# Patient Record
Sex: Female | Born: 1935 | Race: White | Hispanic: No | Smoking: Current every day smoker
Health system: Southern US, Community
[De-identification: ages and names within clinical notes are randomized; demographics above are authoritative.]

## PROBLEM LIST (undated history)

## (undated) DIAGNOSIS — J449 Chronic obstructive pulmonary disease, unspecified: Secondary | ICD-10-CM

## (undated) DIAGNOSIS — C801 Malignant (primary) neoplasm, unspecified: Secondary | ICD-10-CM

## (undated) DIAGNOSIS — I10 Essential (primary) hypertension: Secondary | ICD-10-CM

## (undated) DIAGNOSIS — E785 Hyperlipidemia, unspecified: Secondary | ICD-10-CM

---

## 2012-09-06 ENCOUNTER — Other Ambulatory Visit (HOSPITAL_COMMUNITY): Payer: Self-pay | Admitting: Urology

## 2012-09-06 DIAGNOSIS — D494 Neoplasm of unspecified behavior of bladder: Secondary | ICD-10-CM

## 2012-09-07 ENCOUNTER — Other Ambulatory Visit (HOSPITAL_COMMUNITY): Payer: Self-pay | Admitting: Urology

## 2012-09-07 ENCOUNTER — Encounter (HOSPITAL_COMMUNITY): Payer: Self-pay

## 2012-09-07 ENCOUNTER — Ambulatory Visit (HOSPITAL_COMMUNITY)
Admission: RE | Admit: 2012-09-07 | Discharge: 2012-09-07 | Disposition: A | Discharge status: Home / Self Care | Payer: Medicare Other | Source: Ambulatory Visit | Attending: Urology | Admitting: Urology

## 2012-09-07 DIAGNOSIS — Z8551 Personal history of malignant neoplasm of bladder: Secondary | ICD-10-CM | POA: Insufficient documentation

## 2012-09-07 DIAGNOSIS — I10 Essential (primary) hypertension: Secondary | ICD-10-CM | POA: Insufficient documentation

## 2012-09-07 DIAGNOSIS — Z888 Allergy status to other drugs, medicaments and biological substances status: Secondary | ICD-10-CM | POA: Insufficient documentation

## 2012-09-07 DIAGNOSIS — D494 Neoplasm of unspecified behavior of bladder: Secondary | ICD-10-CM

## 2012-09-07 DIAGNOSIS — N1330 Unspecified hydronephrosis: Secondary | ICD-10-CM | POA: Insufficient documentation

## 2012-09-07 DIAGNOSIS — E785 Hyperlipidemia, unspecified: Secondary | ICD-10-CM | POA: Insufficient documentation

## 2012-09-07 HISTORY — DX: Chronic obstructive pulmonary disease, unspecified: J44.9

## 2012-09-07 HISTORY — DX: Malignant (primary) neoplasm, unspecified: C80.1

## 2012-09-07 HISTORY — DX: Hyperlipidemia, unspecified: E78.5

## 2012-09-07 HISTORY — DX: Essential (primary) hypertension: I10

## 2012-09-07 MED ORDER — HYDROCODONE-ACETAMINOPHEN 5-325 MG PO TABS: 1.0000 | ORAL_TABLET | ORAL | Status: DC | PRN

## 2012-09-07 MED ORDER — CIPROFLOXACIN IN D5W 400 MG/200ML IV SOLN
400.0000 mg | Freq: Once | INTRAVENOUS | Status: AC
  Administered 2012-09-07: 400 mg via INTRAVENOUS
  Filled 2012-09-07: qty 200

## 2012-09-07 MED ORDER — MIDAZOLAM HCL 2 MG/2ML IJ SOLN
INTRAMUSCULAR | Status: AC
  Filled 2012-09-07: qty 4

## 2012-09-07 MED ORDER — FENTANYL CITRATE 0.05 MG/ML IJ SOLN
INTRAMUSCULAR | Status: AC | PRN
  Administered 2012-09-07 (×2): 25 ug via INTRAVENOUS

## 2012-09-07 MED ORDER — MIDAZOLAM HCL 2 MG/2ML IJ SOLN
INTRAMUSCULAR | Status: AC | PRN
  Administered 2012-09-07: 1 mg via INTRAVENOUS
  Administered 2012-09-07 (×2): 0.5 mg via INTRAVENOUS

## 2012-09-07 MED ORDER — FENTANYL CITRATE 0.05 MG/ML IJ SOLN
INTRAMUSCULAR | Status: AC
  Filled 2012-09-07: qty 4

## 2012-09-07 MED ORDER — IOHEXOL 300 MG/ML  SOLN
50.0000 mL | Freq: Once | INTRAMUSCULAR | Status: AC | PRN
  Administered 2012-09-07: 1 mL

## 2012-09-07 NOTE — Procedures (Signed)
Interventional Radiology Procedure Note  Procedure: Placement of bilateral 47F percutaneous nephrostomy tubes Complications: None Recommendations: - Maintain to gravity drainage - Bedrest x 4 hrs, if doping well after can DC home - Return to IR in 4 weeks for tube check and internalization to ureteral stents  Signed,  Sterling Big, MD Vascular & Interventional Radiologist [facility]

## 2012-09-07 NOTE — ED Notes (Signed)
Right nephrostomy tube had some urine leakage from bag as closure was only partly closed.

## 2012-09-07 NOTE — ED Notes (Signed)
Nephrostomy tube dressings clean dry and intact.

## 2012-09-07 NOTE — ED Notes (Signed)
Left and right nephrostomy tube dressings clean dry and intact

## 2012-09-07 NOTE — ED Notes (Signed)
Left and right  Nephrostomy tubes clean dry and intact

## 2012-09-07 NOTE — ED Notes (Signed)
Right and Left nephrostomy tube dressings clean dry and intact

## 2012-09-07 NOTE — ED Notes (Signed)
Nephrostomy tube dressings clean dry and intact, draining clearer pink urine.

## 2012-09-07 NOTE — H&P (Signed)
Colleen Callahan is an 79 y.o. female.   Chief Complaint:  Hx bladder ca New bladder tumor per Dr Jerre Simon B hydronephrosis Attempt at retrograde stents yesterday- unsuccessful with Dr Jerre Simon Request now for Bilateral percutaneous nephrostomy placement  HPI: HLD; HTN; bladder ca  Past Medical History  Diagnosis Date  . Hyperlipidemia   . Hypertension     No past surgical history on file.  No family history on file. Social History:  reports that she has never smoked. She does not have any smokeless tobacco history on file. Her alcohol and drug histories are not on file.  Allergies:  Allergies  Allergen Reactions  . Hctz (Hydrochlorothiazide)   . Lipitor (Atorvastatin)      (Not in a hospital admission)  No results found for this or any previous visit (from the past 48 hour(s)). No results found.  Review of Systems  Gastrointestinal: Negative for nausea, vomiting and abdominal pain.  Genitourinary: Positive for dysuria.  Neurological: Positive for weakness.    There were no vitals taken for this visit. Physical Exam  Constitutional: She is oriented to person, place, and time. She appears well-nourished.  Cardiovascular: Normal rate and regular rhythm.   Murmur heard. Respiratory: Effort normal and breath sounds normal. She has no wheezes.  GI: Soft. Bowel sounds are normal. There is no tenderness.  Musculoskeletal: Normal range of motion.  Uses walker/wc  Neurological: She is alert and oriented to person, place, and time.  Psychiatric: She has a normal mood and affect. Her behavior is normal. Judgment and thought content normal.     Assessment/Plan Hx Bladder ca B hydro Unsuccessful stents with Dr Jerre Simon yesterday Request for B PCNs in IR Pt and caretaker aware of procedure benefits and risks and agreeable to proceed Consent signed and in chart  Leeana Creer A 09/07/2012, 10:26 AM

## 2012-09-07 NOTE — H&P (Signed)
Agree with PA note.    Signed,  Heath K. McCullough, MD Vascular & Interventional Radiologist [facility]  

## 2012-09-07 NOTE — ED Notes (Signed)
Dr Archer Asa reviewed discharge instructions post nephrostomy tube placement and agrees with same to be sent home with patient.

## 2012-09-21 ENCOUNTER — Other Ambulatory Visit (HOSPITAL_COMMUNITY): Payer: Self-pay | Admitting: Urology

## 2012-09-21 DIAGNOSIS — N186 End stage renal disease: Secondary | ICD-10-CM

## 2012-09-24 ENCOUNTER — Ambulatory Visit (HOSPITAL_COMMUNITY)
Admission: RE | Admit: 2012-09-24 | Discharge: 2012-09-24 | Disposition: A | Discharge status: Home / Self Care | Payer: Medicare Other | Source: Ambulatory Visit | Attending: Urology | Admitting: Urology

## 2012-09-24 ENCOUNTER — Other Ambulatory Visit (HOSPITAL_COMMUNITY): Payer: Self-pay | Admitting: Urology

## 2012-09-24 ENCOUNTER — Other Ambulatory Visit: Payer: Self-pay | Admitting: Radiology

## 2012-09-24 ENCOUNTER — Encounter (HOSPITAL_COMMUNITY): Payer: Self-pay

## 2012-09-24 DIAGNOSIS — Z436 Encounter for attention to other artificial openings of urinary tract: Secondary | ICD-10-CM | POA: Insufficient documentation

## 2012-09-24 DIAGNOSIS — Z888 Allergy status to other drugs, medicaments and biological substances status: Secondary | ICD-10-CM | POA: Insufficient documentation

## 2012-09-24 DIAGNOSIS — N1330 Unspecified hydronephrosis: Secondary | ICD-10-CM | POA: Insufficient documentation

## 2012-09-24 DIAGNOSIS — Z79899 Other long term (current) drug therapy: Secondary | ICD-10-CM | POA: Insufficient documentation

## 2012-09-24 DIAGNOSIS — J449 Chronic obstructive pulmonary disease, unspecified: Secondary | ICD-10-CM | POA: Insufficient documentation

## 2012-09-24 DIAGNOSIS — R011 Cardiac murmur, unspecified: Secondary | ICD-10-CM | POA: Insufficient documentation

## 2012-09-24 DIAGNOSIS — N186 End stage renal disease: Secondary | ICD-10-CM

## 2012-09-24 DIAGNOSIS — I10 Essential (primary) hypertension: Secondary | ICD-10-CM | POA: Insufficient documentation

## 2012-09-24 DIAGNOSIS — C679 Malignant neoplasm of bladder, unspecified: Secondary | ICD-10-CM | POA: Insufficient documentation

## 2012-09-24 DIAGNOSIS — J4489 Other specified chronic obstructive pulmonary disease: Secondary | ICD-10-CM | POA: Insufficient documentation

## 2012-09-24 DIAGNOSIS — E785 Hyperlipidemia, unspecified: Secondary | ICD-10-CM | POA: Insufficient documentation

## 2012-09-24 LAB — BASIC METABOLIC PANEL
BUN: 36 mg/dL — ABNORMAL HIGH (ref 6–23)
CO2: 28 mEq/L (ref 19–32)
Chloride: 104 mEq/L (ref 96–112)
Creatinine, Ser: 2.13 mg/dL — ABNORMAL HIGH (ref 0.50–1.10)
Glucose, Bld: 91 mg/dL (ref 70–99)
Potassium: 4 mEq/L (ref 3.5–5.1)

## 2012-09-24 LAB — CBC
Hemoglobin: 10.4 g/dL — ABNORMAL LOW (ref 12.0–15.0)
MCH: 29.1 pg (ref 26.0–34.0)
MCHC: 33.3 g/dL (ref 30.0–36.0)
MCV: 87.2 fL (ref 78.0–100.0)
RBC: 3.58 MIL/uL — ABNORMAL LOW (ref 3.87–5.11)

## 2012-09-24 MED ORDER — MIDAZOLAM HCL 2 MG/2ML IJ SOLN
INTRAMUSCULAR | Status: AC
  Filled 2012-09-24: qty 4

## 2012-09-24 MED ORDER — SODIUM CHLORIDE 0.9 % IV SOLN
INTRAVENOUS | Status: DC
  Administered 2012-09-24: 08:00:00 via INTRAVENOUS

## 2012-09-24 MED ORDER — FENTANYL CITRATE 0.05 MG/ML IJ SOLN
INTRAMUSCULAR | Status: AC
  Filled 2012-09-24: qty 2

## 2012-09-24 MED ORDER — FENTANYL CITRATE 0.05 MG/ML IJ SOLN
INTRAMUSCULAR | Status: AC | PRN
  Administered 2012-09-24 (×2): 50 ug via INTRAVENOUS

## 2012-09-24 MED ORDER — MIDAZOLAM HCL 2 MG/2ML IJ SOLN
INTRAMUSCULAR | Status: AC | PRN
  Administered 2012-09-24 (×2): 1 mg via INTRAVENOUS

## 2012-09-24 MED ORDER — IOHEXOL 300 MG/ML  SOLN
50.0000 mL | Freq: Once | INTRAMUSCULAR | Status: AC | PRN
  Administered 2012-09-24: 25 mL via INTRAVENOUS

## 2012-09-24 MED ORDER — CIPROFLOXACIN IN D5W 400 MG/200ML IV SOLN
400.0000 mg | Freq: Once | INTRAVENOUS | Status: AC
  Administered 2012-09-24: 400 mg via INTRAVENOUS
  Filled 2012-09-24: qty 200

## 2012-09-24 NOTE — Procedures (Signed)
Procedure:  Bilateral nephrostograms, bilateral PCN change, right ureteral stent placement. Findings:  Critical distal left ureteral stricture.  Unable to pass with catheter and therefore unable to place ureteral stent.  10 Fr PCN replaced and attached to gravity bag. Signif residual right hydronephrosis.  8 Fr x 22 cm right ureteral stent placed from pelvis to bladder.  New 10 Fr PCN placed and capped.

## 2012-09-24 NOTE — H&P (Signed)
Colleen Callahan is an 79 y.o. female.   Chief Complaint:  Hx bladder ca New bladder tumor per Dr Jerre Simon B hydronephrosis with (B) PCN placement on 4/19. She has done well. She is scheduled today attempt at (B) JJ stent placement.  HPI: HLD; HTN; bladder ca  Past Medical History  Diagnosis Date  . Hyperlipidemia   . Hypertension   . Cancer     bladder  . COPD (chronic obstructive pulmonary disease)     History reviewed. No pertinent past surgical history.  No family history on file. Social History:  reports that she has never smoked. She does not have any smokeless tobacco history on file. Her alcohol and drug histories are not on file.  Allergies:  Allergies  Allergen Reactions  . Hctz (Hydrochlorothiazide) Other (See Comments)    unknown  . Lipitor (Atorvastatin) Other (See Comments)    unknown    Meds: Norvasc, Tenormin, Advair, Niferex, Remeron, Zantac, Crestor  Results for orders placed during the hospital encounter of 09/24/12 (from the past 48 hour(s))  CBC     Status: Abnormal   Collection Time    09/24/12  7:42 AM      Result Value Range   WBC 8.2  4.0 - 10.5 K/uL   RBC 3.58 (*) 3.87 - 5.11 MIL/uL   Hemoglobin 10.4 (*) 12.0 - 15.0 g/dL   HCT 16.1 (*) 09.6 - 04.5 %   MCV 87.2  78.0 - 100.0 fL   MCH 29.1  26.0 - 34.0 pg   MCHC 33.3  30.0 - 36.0 g/dL   RDW 40.9  81.1 - 91.4 %   Platelets 314  150 - 400 K/uL  PROTIME-INR     Status: None   Collection Time    09/24/12  7:42 AM      Result Value Range   Prothrombin Time 12.7  11.6 - 15.2 seconds   INR 0.96  0.00 - 1.49  BASIC METABOLIC PANEL     Status: Abnormal   Collection Time    09/24/12  7:42 AM      Result Value Range   Sodium 140  135 - 145 mEq/L   Potassium 4.0  3.5 - 5.1 mEq/L   Chloride 104  96 - 112 mEq/L   CO2 28  19 - 32 mEq/L   Glucose, Bld 91  70 - 99 mg/dL   BUN 36 (*) 6 - 23 mg/dL   Creatinine, Ser 7.82 (*) 0.50 - 1.10 mg/dL   Calcium 9.5  8.4 - 95.6 mg/dL   GFR calc non Af Amer 21  (*) >90 mL/min   GFR calc Af Amer 25 (*) >90 mL/min   Comment:            The eGFR has been calculated     using the CKD EPI equation.     This calculation has not been     validated in all clinical     situations.     eGFR's persistently     <90 mL/min signify     possible Chronic Kidney Disease.   No results found.  Review of Systems  Gastrointestinal: Negative for nausea, vomiting and abdominal pain.  Neurological: Positive for weakness.    Blood pressure 122/69, pulse 62, temperature 97.4 F (36.3 C), temperature source Oral, resp. rate 16, height 5\' 6"  (1.676 m), weight 160 lb (72.576 kg), SpO2 95.00%.  Physical Exam  Constitutional: She is oriented to person, place, and time. She appears well-nourished.  Cardiovascular: Normal rate and regular rhythm.   Murmur heard. Respiratory: Effort normal and breath sounds normal. She has no wheezes.  GI: Soft. Bowel sounds are normal. There is no tenderness.  Musculoskeletal: Normal range of motion.  Uses walker/wc  Neurological: She is alert and oriented to person, place, and time.  Psychiatric: She has a normal mood and affect. Her behavior is normal. Judgment and thought content normal.     Assessment/Plan Hx Bladder ca B hydro s/p (B)PCN 4/19 Here for attempt at JJ stents, discussed procedure, risks, complications, use of sedation Pt and caretaker aware of procedure benefits and risks and agreeable to proceed Labs reviewed, ok Consent signed and in chart  Brayton El 09/24/2012, 8:42 AM

## 2012-09-24 NOTE — H&P (Signed)
Agree 

## 2012-10-09 ENCOUNTER — Emergency Department (HOSPITAL_COMMUNITY)
Admission: EM | Admit: 2012-10-09 | Discharge: 2012-10-09 | Disposition: A | Discharge status: Home / Self Care | Payer: Medicare Other | Attending: Emergency Medicine | Admitting: Emergency Medicine

## 2012-10-09 ENCOUNTER — Emergency Department (HOSPITAL_COMMUNITY): Payer: Medicare Other

## 2012-10-09 ENCOUNTER — Encounter (HOSPITAL_COMMUNITY): Payer: Self-pay | Admitting: Emergency Medicine

## 2012-10-09 DIAGNOSIS — I10 Essential (primary) hypertension: Secondary | ICD-10-CM | POA: Insufficient documentation

## 2012-10-09 DIAGNOSIS — T593X4A Toxic effect of lacrimogenic gas, undetermined, initial encounter: Secondary | ICD-10-CM | POA: Insufficient documentation

## 2012-10-09 DIAGNOSIS — N99528 Other complication of other external stoma of urinary tract: Secondary | ICD-10-CM

## 2012-10-09 DIAGNOSIS — E785 Hyperlipidemia, unspecified: Secondary | ICD-10-CM | POA: Insufficient documentation

## 2012-10-09 DIAGNOSIS — Z79899 Other long term (current) drug therapy: Secondary | ICD-10-CM | POA: Insufficient documentation

## 2012-10-09 DIAGNOSIS — J4489 Other specified chronic obstructive pulmonary disease: Secondary | ICD-10-CM | POA: Insufficient documentation

## 2012-10-09 DIAGNOSIS — J449 Chronic obstructive pulmonary disease, unspecified: Secondary | ICD-10-CM | POA: Insufficient documentation

## 2012-10-09 DIAGNOSIS — Z8551 Personal history of malignant neoplasm of bladder: Secondary | ICD-10-CM | POA: Insufficient documentation

## 2012-10-09 DIAGNOSIS — Y833 Surgical operation with formation of external stoma as the cause of abnormal reaction of the patient, or of later complication, without mention of misadventure at the time of the procedure: Secondary | ICD-10-CM | POA: Insufficient documentation

## 2012-10-09 MED ORDER — IOHEXOL 300 MG/ML  SOLN
50.0000 mL | Freq: Once | INTRAMUSCULAR | Status: AC | PRN
  Administered 2012-10-09: 10 mL

## 2012-10-09 NOTE — ED Notes (Signed)
Patient denies pain and is resting comfortably.  

## 2012-10-09 NOTE — ED Notes (Signed)
Pt here from home with care giver c/o urostomy bag leaking at tub on right side; pt sts currently being treated for UTI also

## 2012-10-09 NOTE — ED Provider Notes (Signed)
History     CSN: [id number]  Arrival date & time 10/09/12  1105   First MD Initiated Contact with Patient 10/09/12 1155      No chief complaint on file.   (Consider location/radiation/quality/duration/timing/severity/associated sxs/prior treatment) HPI Comments: 79 y.o. female who presents to the ER w/ her urostomy bag that is leaking. Pt states / and her family member, state that the leaking started this morning. No fevers or chills. Pt had the bag placed secondary to kidney stones. She is on prophylaxis for UTI w/ bactrim.   Patient is a 79 y.o. female presenting with general illness. The history is provided by the patient and a caregiver.  Illness Location:  Urostomy bag on back Severity:  Mild Onset quality:  Gradual Timing:  Constant Progression:  Unchanged Chronicity:  New Associated symptoms: no abdominal pain, no chest pain, no congestion, no cough, no diarrhea, no fatigue, no fever, no headaches, no rash, no vomiting and no wheezing     Past Medical History  Diagnosis Date  . Hyperlipidemia   . Hypertension   . Cancer     bladder  . COPD (chronic obstructive pulmonary disease)     History reviewed. No pertinent past surgical history.  History reviewed. No pertinent family history.  History  Substance Use Topics  . Smoking status: Never Smoker   . Smokeless tobacco: Not on file  . Alcohol Use: Not on file    OB History   Grav Para Term Preterm Abortions TAB SAB Ect Mult Living                  Review of Systems  Constitutional: Negative for fever, chills and fatigue.  HENT: Negative for congestion, facial swelling, drooling, neck pain and dental problem.   Eyes: Negative for pain, discharge and itching.  Respiratory: Negative for cough, choking, wheezing and stridor.   Cardiovascular: Negative for chest pain.  Gastrointestinal: Negative for vomiting, abdominal pain and diarrhea.  Endocrine: Negative for cold intolerance and heat intolerance.   Genitourinary: Negative for vaginal discharge, difficulty urinating and vaginal pain.  Skin: Negative for pallor and rash.  Neurological: Negative for dizziness, light-headedness and headaches.  Psychiatric/Behavioral: Negative for behavioral problems and agitation.    Allergies  Hctz and Lipitor  Home Medications   Current Outpatient Rx  Name  Route  Sig  Dispense  Refill  . Acetaminophen (TYLENOL ARTHRITIS PAIN PO)   Oral   Take 1 tablet by mouth 4 (four) times daily as needed (arthritis).         Marland Kitchen amLODipine (NORVASC) 10 MG tablet   Oral   Take 10 mg by mouth daily.         Marland Kitchen atenolol (TENORMIN) 50 MG tablet   Oral   Take 50 mg by mouth daily.         . Fluticasone-Salmeterol (ADVAIR) 250-50 MCG/DOSE AEPB   Inhalation   Inhale 1 puff into the lungs every 12 (twelve) hours.         . iron polysaccharides (NIFEREX) 150 MG capsule   Oral   Take 150 mg by mouth 2 (two) times daily.         . mirtazapine (REMERON) 15 MG tablet   Oral   Take 15 mg by mouth at bedtime.         . ranitidine (ZANTAC) 150 MG tablet   Oral   Take 150 mg by mouth 2 (two) times daily.         Marland Kitchen  rosuvastatin (CRESTOR) 10 MG tablet   Oral   Take 10 mg by mouth at bedtime.           BP 104/83  Pulse 60  Temp(Src) 98.3 F (36.8 C) (Oral)  Resp 18  SpO2 95%  Physical Exam  Constitutional: She is oriented to person, place, and time. She appears well-developed. No distress.  HENT:  Head: Normocephalic and atraumatic.  Eyes: Pupils are equal, round, and reactive to light. Right eye exhibits no discharge. Left eye exhibits no discharge.  Neck: Neck supple. No tracheal deviation present.  Cardiovascular: Normal rate.  Exam reveals no gallop and no friction rub.   Pulmonary/Chest: No stridor. No respiratory distress. She has no wheezes.  Abdominal: Soft. She exhibits no distension. There is no tenderness. There is no rebound.  Genitourinary:  Pt with two urostomy tube  sites -- they are sutured in place, clear, dry, intact. The right urostomy tube, the outer portion, has a damaged tube that is leaking her urine. It is still draining.   Musculoskeletal: She exhibits no edema and no tenderness.  Neurological: She is alert and oriented to person, place, and time.  Skin: Skin is warm. She is not diaphoretic.    ED Course  Procedures (including critical care time)  Labs Reviewed - No data to display No results found.  MDM  Have placed pt's tube back into the urine collection bag. Have tried to call interventional team that did the procedure but unfortunately they are having an office meeting and I have left a voice mail. Pt will need the outer portion of her tubing fixed. Does not need labs as no fevers, no vital sign abnormalities, no back pain -- and both urostomy sites are adequately draining urine.   I have talked to our interventional team, who state they are happy to change out pt's IR tube while she is in the ER -- pt is taken to the IR suite from the ER.   IR team has fixed pt's tubing from urostomy site -- they continue to drain normal appearing urine.   1. Complication of urostomy            Bernadene Person, MD 10/09/12 1446

## 2012-10-09 NOTE — ED Notes (Signed)
Pt with bilateral urostomy tubes.  Right side tube has pulled loose from the connector and is leaking.  Family member advises it has been leaking since it was placed 2 weeks ago.

## 2012-10-10 ENCOUNTER — Telehealth (HOSPITAL_COMMUNITY): Payer: Self-pay | Admitting: *Deleted

## 2012-10-11 NOTE — ED Provider Notes (Signed)
I saw and evaluated the patient, reviewed the resident's note and I agree with the findings and plan.   .Face to face Exam:  General:  Awake HEENT:  Atraumatic Resp:  Normal effort Abd:  Nondistended Neuro:No focal weakness Lymph: No adenopathy  Nelia Shi, MD 10/11/12 1353

## 2012-11-06 ENCOUNTER — Other Ambulatory Visit (HOSPITAL_COMMUNITY): Payer: Self-pay | Admitting: Urology

## 2012-11-06 DIAGNOSIS — D494 Neoplasm of unspecified behavior of bladder: Secondary | ICD-10-CM

## 2012-11-07 ENCOUNTER — Other Ambulatory Visit: Payer: Self-pay | Admitting: Radiology

## 2012-11-07 ENCOUNTER — Encounter (HOSPITAL_COMMUNITY): Payer: Self-pay | Admitting: Pharmacy Technician

## 2012-11-08 ENCOUNTER — Ambulatory Visit (HOSPITAL_COMMUNITY): Admission: RE | Admit: 2012-11-08 | Payer: Medicare Other | Source: Ambulatory Visit

## 2012-11-08 ENCOUNTER — Other Ambulatory Visit (HOSPITAL_COMMUNITY): Payer: Self-pay | Admitting: Urology

## 2012-11-08 ENCOUNTER — Telehealth (HOSPITAL_COMMUNITY): Payer: Self-pay | Admitting: Radiology

## 2012-11-08 ENCOUNTER — Ambulatory Visit (HOSPITAL_COMMUNITY)
Admission: RE | Admit: 2012-11-08 | Discharge: 2012-11-08 | Disposition: A | Discharge status: Home / Self Care | Payer: Medicare Other | Source: Ambulatory Visit | Attending: Urology | Admitting: Urology

## 2012-11-08 ENCOUNTER — Encounter (HOSPITAL_COMMUNITY): Payer: Self-pay

## 2012-11-08 DIAGNOSIS — J449 Chronic obstructive pulmonary disease, unspecified: Secondary | ICD-10-CM | POA: Insufficient documentation

## 2012-11-08 DIAGNOSIS — D494 Neoplasm of unspecified behavior of bladder: Secondary | ICD-10-CM | POA: Insufficient documentation

## 2012-11-08 DIAGNOSIS — J4489 Other specified chronic obstructive pulmonary disease: Secondary | ICD-10-CM | POA: Insufficient documentation

## 2012-11-08 DIAGNOSIS — E785 Hyperlipidemia, unspecified: Secondary | ICD-10-CM | POA: Insufficient documentation

## 2012-11-08 DIAGNOSIS — Z8551 Personal history of malignant neoplasm of bladder: Secondary | ICD-10-CM | POA: Insufficient documentation

## 2012-11-08 DIAGNOSIS — N1330 Unspecified hydronephrosis: Secondary | ICD-10-CM | POA: Insufficient documentation

## 2012-11-08 DIAGNOSIS — I10 Essential (primary) hypertension: Secondary | ICD-10-CM | POA: Insufficient documentation

## 2012-11-08 MED ORDER — MIDAZOLAM HCL 2 MG/2ML IJ SOLN
INTRAMUSCULAR | Status: AC | PRN
  Administered 2012-11-08 (×2): 1 mg via INTRAVENOUS

## 2012-11-08 MED ORDER — CIPROFLOXACIN IN D5W 400 MG/200ML IV SOLN
400.0000 mg | Freq: Once | INTRAVENOUS | Status: AC
  Administered 2012-11-08: 400 mg via INTRAVENOUS
  Filled 2012-11-08: qty 200

## 2012-11-08 MED ORDER — IOHEXOL 300 MG/ML  SOLN
50.0000 mL | Freq: Once | INTRAMUSCULAR | Status: AC | PRN
  Administered 2012-11-08: 50 mL

## 2012-11-08 MED ORDER — FENTANYL CITRATE 0.05 MG/ML IJ SOLN
INTRAMUSCULAR | Status: AC | PRN
  Administered 2012-11-08 (×2): 50 ug via INTRAVENOUS

## 2012-11-08 MED ORDER — SODIUM CHLORIDE 0.9 % IV SOLN: INTRAVENOUS | Status: DC

## 2012-11-08 MED ORDER — MIDAZOLAM HCL 2 MG/2ML IJ SOLN
INTRAMUSCULAR | Status: AC
  Filled 2012-11-08: qty 4

## 2012-11-08 MED ORDER — FENTANYL CITRATE 0.05 MG/ML IJ SOLN
INTRAMUSCULAR | Status: AC
  Filled 2012-11-08: qty 4

## 2012-11-08 NOTE — Procedures (Signed)
Successful right sided PCN exchange.  The right sided PCN will be capped for a trial of attempted internalization. Successful traversal of distal left ureteral obstruction, however stent placement was deemed inappropriate secondary to the large irregular mass replacing the left lateral wall of the urinary bladder. Successful left sided PCN exchange.  The left sided PCN was connected to a gravity bag.

## 2012-11-08 NOTE — Telephone Encounter (Signed)
Patient arrived at [facility] for IR procedure instead of [facility].  Arrangements made for patient to be treated at [facility]

## 2012-11-08 NOTE — H&P (Signed)
Colleen Callahan is an 79 y.o. female.   Chief Complaint: Bladder Ca hx New tumor New hydronephrosis with B percutaneous nephrostomy placed 09/08/12 JJ on Rt 09/24/12 Unsuccessful on left Scheduled now for Rt nephrostogram with possible nephrostomy change Possible Lt JJ placement vs nephrostomy change HPI: HLD; HTN; bladder ca; COPD  Past Medical History  Diagnosis Date  . Hyperlipidemia   . Hypertension   . Cancer     bladder  . COPD (chronic obstructive pulmonary disease)     History reviewed. No pertinent past surgical history.  No family history on file. Social History:  reports that she has never smoked. She does not have any smokeless tobacco history on file. Her alcohol and drug histories are not on file.  Allergies:  Allergies  Allergen Reactions  . Hctz (Hydrochlorothiazide) Other (See Comments)    unknown  . Lipitor (Atorvastatin) Other (See Comments)    unknown     (Not in a hospital admission)  No results found for this or any previous visit (from the past 48 hour(s)). No results found.  Review of Systems  Constitutional: Negative for fever.  Respiratory: Negative for cough.   Cardiovascular: Negative for chest pain.  Gastrointestinal: Negative for nausea, vomiting and abdominal pain.  Musculoskeletal: Positive for back pain.  Neurological: Positive for weakness. Negative for dizziness and headaches.    There were no vitals taken for this visit. Physical Exam  Constitutional: She is oriented to person, place, and time. She appears well-developed.  Cardiovascular: Normal rate, regular rhythm and normal heart sounds.   No murmur heard. Respiratory: Effort normal. She has wheezes.  GI: Soft. Bowel sounds are normal. There is no tenderness.  Musculoskeletal: Normal range of motion.  Moves all 4s; Uses wc  Neurological: She is alert and oriented to person, place, and time.  Skin: Skin is warm and dry.  Psychiatric: She has a normal mood and affect. Her  behavior is normal. Judgment and thought content normal.     Assessment/Plan Hx bladder ca New bladder tumor; new hydronephrosis B PCN 09/08/12 R JJ 09/24/12 Unsuccessful on left Scheduled now for Reattempt on left B nephrostograms with prob exchanges Pt and family aware of procedure benefits and risks and agreeable to proceed Consent signed and in chart  Colleen Callahan A 11/08/2012, 1:49 PM

## 2012-11-08 NOTE — ED Notes (Signed)
Patient denies pain and is resting comfortably.  

## 2012-12-07 ENCOUNTER — Emergency Department (HOSPITAL_COMMUNITY)
Admission: EM | Admit: 2012-12-07 | Discharge: 2012-12-08 | Disposition: A | Discharge status: Home / Self Care | Payer: Medicare Other | Attending: Emergency Medicine | Admitting: Emergency Medicine

## 2012-12-07 ENCOUNTER — Emergency Department (HOSPITAL_COMMUNITY): Payer: Medicare Other

## 2012-12-07 ENCOUNTER — Encounter (HOSPITAL_COMMUNITY): Payer: Self-pay | Admitting: Family Medicine

## 2012-12-07 DIAGNOSIS — Y833 Surgical operation with formation of external stoma as the cause of abnormal reaction of the patient, or of later complication, without mention of misadventure at the time of the procedure: Secondary | ICD-10-CM | POA: Insufficient documentation

## 2012-12-07 DIAGNOSIS — E785 Hyperlipidemia, unspecified: Secondary | ICD-10-CM | POA: Insufficient documentation

## 2012-12-07 DIAGNOSIS — I10 Essential (primary) hypertension: Secondary | ICD-10-CM | POA: Insufficient documentation

## 2012-12-07 DIAGNOSIS — J449 Chronic obstructive pulmonary disease, unspecified: Secondary | ICD-10-CM | POA: Insufficient documentation

## 2012-12-07 DIAGNOSIS — R109 Unspecified abdominal pain: Secondary | ICD-10-CM | POA: Insufficient documentation

## 2012-12-07 DIAGNOSIS — Z79899 Other long term (current) drug therapy: Secondary | ICD-10-CM | POA: Insufficient documentation

## 2012-12-07 DIAGNOSIS — Z8551 Personal history of malignant neoplasm of bladder: Secondary | ICD-10-CM | POA: Insufficient documentation

## 2012-12-07 DIAGNOSIS — J4489 Other specified chronic obstructive pulmonary disease: Secondary | ICD-10-CM | POA: Insufficient documentation

## 2012-12-07 MED ORDER — HYDROCODONE-ACETAMINOPHEN 5-325 MG PO TABS
1.0000 | ORAL_TABLET | Freq: Once | ORAL | Status: AC
  Administered 2012-12-07: 1 via ORAL
  Filled 2012-12-07: qty 1

## 2012-12-07 NOTE — ED Provider Notes (Signed)
History    CSN: [id number] Arrival date & time 12/07/12  1613  First MD Initiated Contact with Patient 12/07/12 2150     Chief Complaint  Patient presents with  . kidney stent issues    (Consider location/radiation/quality/duration/timing/severity/associated sxs/prior Treatment) HPI patient presents to [facility] home staff noticed that she may have pulled out her nephrostomy tube partially on the right today. She complains of right flank pain for the past 2 weeks which is constant. Treated with hydrocodone. No other complaint. Maximum temperature 99.1 Past Medical History  Diagnosis Date  . Hyperlipidemia   . Hypertension   . Cancer     bladder  . COPD (chronic obstructive pulmonary disease)    History reviewed. No pertinent past surgical history. History reviewed. No pertinent family history. History  Substance Use Topics  . Smoking status: Never Smoker   . Smokeless tobacco: Not on file  . Alcohol Use: No   OB History   Grav Para Term Preterm Abortions TAB SAB Ect Mult Living                 Review of Systems  Constitutional: Negative.   HENT: Negative.   Respiratory: Negative.   Cardiovascular: Negative.   Gastrointestinal: Negative.   Genitourinary: Positive for flank pain.  Musculoskeletal: Negative.   Skin: Negative.   Neurological: Negative.   Psychiatric/Behavioral: Negative.   All other systems reviewed and are negative.    Allergies  Hctz and Lipitor  Home Medications   Current Outpatient Rx  Name  Route  Sig  Dispense  Refill  . acetaminophen (TYLENOL) 650 MG CR tablet   Oral   Take 650 mg by mouth 4 (four) times daily as needed for pain.         Marland Kitchen amLODipine (NORVASC) 10 MG tablet   Oral   Take 10 mg by mouth daily.         Marland Kitchen atenolol (TENORMIN) 50 MG tablet   Oral   Take 50 mg by mouth every morning.          . Fluticasone-Salmeterol (ADVAIR) 250-50 MCG/DOSE AEPB   Inhalation   Inhale 1 puff into the lungs every 12 (twelve)  hours.         Marland Kitchen guaiFENesin (ROBITUSSIN) 100 MG/5ML liquid   Oral   Take 200 mg by mouth every 6 (six) hours as needed for cough.         . iron polysaccharides (NIFEREX) 150 MG capsule   Oral   Take 150 mg by mouth 2 (two) times daily.         . mirtazapine (REMERON) 15 MG tablet   Oral   Take 15 mg by mouth at bedtime.         . ranitidine (ZANTAC) 150 MG tablet   Oral   Take 150 mg by mouth 2 (two) times daily.         . rosuvastatin (CRESTOR) 10 MG tablet   Oral   Take 10 mg by mouth at bedtime.         . sulfamethoxazole-trimethoprim (BACTRIM DS,SEPTRA DS) 800-160 MG per tablet   Oral   Take 1 tablet by mouth 2 (two) times daily. Start 6/12 & stop 6/22          BP 130/63  Pulse 71  Temp(Src) 97.7 F (36.5 C) (Oral)  Resp 18  SpO2 97% Physical Exam  Nursing note and vitals reviewed. Constitutional: She appears well-developed and well-nourished.  HENT:  Head:  Normocephalic and atraumatic.  Eyes: Conjunctivae are normal. Pupils are equal, round, and reactive to light.  Neck: Neck supple. No tracheal deviation present. No thyromegaly present.  Cardiovascular: Normal rate and regular rhythm.   No murmur heard. Pulmonary/Chest: Effort normal and breath sounds normal.  Abdominal: Soft. Bowel sounds are normal. She exhibits no distension. There is no tenderness.  Genitourinary:  Bilateral nephrostomy tubes in place. Ttube right appears to be draining yellow urine  Musculoskeletal: Normal range of motion. She exhibits no edema and no tenderness.  Neurological: She is alert. Coordination normal.  Skin: Skin is warm and dry. No rash noted.  Psychiatric: She has a normal mood and affect.    ED Course  Procedures (including critical care time) Labs Reviewed - No data to display No results found. No diagnosis found. Of 30 a.m. Pain improved after treatment with Norco. X-ray reviewed by me Results for orders placed during the hospital encounter of  09/24/12  CBC      Result Value Range   WBC 8.2  4.0 - 10.5 K/uL   RBC 3.58 (*) 3.87 - 5.11 MIL/uL   Hemoglobin 10.4 (*) 12.0 - 15.0 g/dL   HCT 47.8 (*) 29.5 - 62.1 %   MCV 87.2  78.0 - 100.0 fL   MCH 29.1  26.0 - 34.0 pg   MCHC 33.3  30.0 - 36.0 g/dL   RDW 30.8  65.7 - 84.6 %   Platelets 314  150 - 400 K/uL  PROTIME-INR      Result Value Range   Prothrombin Time 12.7  11.6 - 15.2 seconds   INR 0.96  0.00 - 1.49  BASIC METABOLIC PANEL      Result Value Range   Sodium 140  135 - 145 mEq/L   Potassium 4.0  3.5 - 5.1 mEq/L   Chloride 104  96 - 112 mEq/L   CO2 28  19 - 32 mEq/L   Glucose, Bld 91  70 - 99 mg/dL   BUN 36 (*) 6 - 23 mg/dL   Creatinine, Ser 9.62 (*) 0.50 - 1.10 mg/dL   Calcium 9.5  8.4 - 95.2 mg/dL   GFR calc non Af Amer 21 (*) >90 mL/min   GFR calc Af Amer 25 (*) >90 mL/min   Dg Abd 1 View  12/08/2012   *RADIOLOGY REPORT*  Clinical Data: Right lower quadrant abdominal pain and pubic pain. Check right-sided nephrostomy tube.  ABDOMEN - 1 VIEW  Comparison: CT of the abdomen and pelvis performed 11/06/2009, and nephrostograms performed 11/08/2012  Findings: The patient's right-sided nephrostomy tube and ureteral stent are grossly unchanged in position, aside from mild lateral drift of the ureteral stent within the renal calyces.  The left- sided nephrostomy tube is grossly unchanged in appearance.  No definite stones are characterized.  The visualized bowel gas pattern is grossly unremarkable, with a relative large amount stool noted along the ascending colon.  There is no evidence for bowel obstruction.  No free intra-abdominal air is identified, though evaluation for free air is suboptimal on a single supine view.  Mild degenerative change is noted along the lumbar spine, and mild sclerotic change is seen at the sacroiliac joints.  There is significant degenerative change at the left hip joint, with joint space loss, chronic deformity of the left femoral head, and associated  diffuse sclerosis.  The visualized lung bases are grossly clear.  IMPRESSION:  1.  Right-sided nephrostomy tube and ureteral stent are grossly unchanged in position; left-sided  nephrostomy tube is grossly unchanged in appearance. 2.  Unremarkable bowel gas pattern; no free intra-abdominal air seen. 3.  Chronic degenerative change at the left hip joint, with joint space loss, chronic deformity of the left femoral head, and diffuse sclerosis.   Original Report Authenticated By: Tonia Ghent, M.D.   Ir Nephrostomy Tube Change  11/08/2012   *RADIOLOGY REPORT*  Indication: History of bladder cancer, post bilateral percutaneous nephrostomy placement and left-sided right-sided double-J ureteral catheter placement.  Please perform right-sided antegrade nephrostogram to evaluate for persistent need of right-sided nephrostomy.  Please attempt to internalize left sided nephrostomy with placement of a left-sided ureteral stent.  1.  BILATERAL ANTEGRADE NEPHROSTOGRAM 2.  FLUOROSCOPIC GUIDED BILATERAL SIDED PCN EXCHANGE 3.  FAILED ATTEMPTED PERCUTANEOUS PLACEMENT OF LEFT SIDED URETERAL STENT SECONDARY TO LARGE IRREGULAR BLADDER MASS  Comparison: Bilateral ultrasound fluoroscopic guided percutaneous nephrostomy catheter placement - 09/07/2012; right-sided ureteral stent placement and attempted left-sided ureteral stent placement - 09/24/2012  Contrast: A total of 50 ml Omnipaque-300 was administered in the both collecting systems  Fluoroscopy Time: 10 minutes, 12 seconds.  Complications: None immediate  Technique:  Informed written consent was obtained from the patient after a discussion of the risks, benefits and alternatives to treatment. Questions regarding the procedure were encouraged and answered.  A timeout was performed prior to the initiation of the procedure.  The bilateral flanks and external portions of the existing nephrostomy catheters were prepped and draped in the usual sterile fashion.  A sterile drape was  applied covering the operative field. Maximum barrier sterile technique with sterile gowns and gloves were used for the procedure.  A timeout was performed prior to the initiation of the procedure.  Beginning with the right-sided nephrostomy, a right-sided antegrade nephrostogram was performed.  The existing nephrostomy catheter was cut and cannulated with a Benson wire which was coiled within the renal pelvis.  Under intermittent fluoroscopic guidance, the existing nephrostomy catheter was exchanged for a new 10.2 [place] all-purpose drainage catheter.  Contrast injection confirmed appropriate positioning within the renal pelvis and a post exchange fluoroscopic image was obtained.  The catheter was locked and secured to the skin with a suture.  A dressing was placed.  Attention was now paid towards the left sided nephrostomy.  Limited contrast injection was performed demonstrating appropriate positioning of the left-sided nephrostomy coiled within the persistently dilated left sided renal pelvis.  The external portion of the left nephrostomy was cut and cannulated with a Benson wire which was coiled within the right renal pelvis.  Under intermittent fluoroscopic guidance, the nephrostomy was exchanged for a 9-French vascular sheath which with the use of a Kumpe catheter was advanced to the level of the distal left ureter.   Contrast injection was performed of the distal aspect of the left ureter demonstrating a moderate length irregular occlusion involving the distal aspect of the left ureter.  With the use of a regular Glidewire, a common catheter was manipulated into the urinary bladder, however contrast injection demonstrated marked irregularity of the left side of the urinary bladder wall which was thus deemed inappropriate for a ureteral stent placement.  As such, the 9-French vascular sheath was exchanged for a new 10- [place] nephrostomy catheter which was coiled locked within the left renal pelvis.  A spot  fluoroscopic image was obtained.  The catheter exit site was secured with interrupted suture.  The catheter was reconnected to a drainage bag.  The patient tolerated the above procedures well without immediate postprocedural complication.  Findings:  Right-sided antegrade nephrostogram demonstrates moderate to severe dilatation of the right renal collecting system and dilatation of the right ureter to the level of the urinary bladder.  Contrast does passed through the previously placed right-sided double-J ureteral stent, however the right renal collecting system is noted to be markedly patulous.  As such, the right-sided nephrostomy catheter was capped for a trial of internalization.  Left-sided antegrade nephrostogram demonstrates a moderate length irregular occlusion involving the distal aspect of the left ureter. This area of occlusion was successfully traversed however contrast injection within the urinary bladder demonstrates marked irregular filling of the left side of the urinary bladder secondary to the patient's known bladder carcinoma.  As such, it was felt that placement of a ureteral stent which surely fail.  As such, a new 10- [place] nephrostomy was coiled and locked within the left renal pelvis.  The left side nephrostomy was reconnected to a drainage bag.  Impression:  1.  Successful fluoroscopic guided exchange of right sided 10.2 French percutaneous nephrostomy catheter.  As right-sided antegrade nephrostogram demonstrates moderate to severe right-sided pelvocaliectasis and ureterectasis despite the patency of the right- sided ureteral stent, the right sided nephrostomy was capped for an attempted trial of internalization prior to removal of the percutaneous access.  2.  Successful traversal of the moderate length irregular occlusion involving the distal aspect of the left ureter however the majority of the left lateral wall of the urinary bladder was noted to be replaced by an irregular bladder  tumor.  As such, ureteral stent placement was deemed inappropriate. 3.  Successful fluoroscopic guided exchange of a left-sided 10- French nephrostomy catheter.  The left sided nephrostomy was connected to a drainage bag.   Original Report Authenticated By: Tacey Ruiz, MD   Ir Nephrostomy Tube Change  11/08/2012   *RADIOLOGY REPORT*  Indication: History of bladder cancer, post bilateral percutaneous nephrostomy placement and left-sided right-sided double-J ureteral catheter placement.  Please perform right-sided antegrade nephrostogram to evaluate for persistent need of right-sided nephrostomy.  Please attempt to internalize left sided nephrostomy with placement of a left-sided ureteral stent.  1.  BILATERAL ANTEGRADE NEPHROSTOGRAM 2.  FLUOROSCOPIC GUIDED BILATERAL SIDED PCN EXCHANGE 3.  FAILED ATTEMPTED PERCUTANEOUS PLACEMENT OF LEFT SIDED URETERAL STENT SECONDARY TO LARGE IRREGULAR BLADDER MASS  Comparison: Bilateral ultrasound fluoroscopic guided percutaneous nephrostomy catheter placement - 09/07/2012; right-sided ureteral stent placement and attempted left-sided ureteral stent placement - 09/24/2012  Contrast: A total of 50 ml Omnipaque-300 was administered in the both collecting systems  Fluoroscopy Time: 10 minutes, 12 seconds.  Complications: None immediate  Technique:  Informed written consent was obtained from the patient after a discussion of the risks, benefits and alternatives to treatment. Questions regarding the procedure were encouraged and answered.  A timeout was performed prior to the initiation of the procedure.  The bilateral flanks and external portions of the existing nephrostomy catheters were prepped and draped in the usual sterile fashion.  A sterile drape was applied covering the operative field. Maximum barrier sterile technique with sterile gowns and gloves were used for the procedure.  A timeout was performed prior to the initiation of the procedure.  Beginning with the right-sided  nephrostomy, a right-sided antegrade nephrostogram was performed.  The existing nephrostomy catheter was cut and cannulated with a Benson wire which was coiled within the renal pelvis.  Under intermittent fluoroscopic guidance, the existing nephrostomy catheter was exchanged for a new 10.2 [place] all-purpose drainage catheter.  Contrast injection confirmed appropriate  positioning within the renal pelvis and a post exchange fluoroscopic image was obtained.  The catheter was locked and secured to the skin with a suture.  A dressing was placed.  Attention was now paid towards the left sided nephrostomy.  Limited contrast injection was performed demonstrating appropriate positioning of the left-sided nephrostomy coiled within the persistently dilated left sided renal pelvis.  The external portion of the left nephrostomy was cut and cannulated with a Benson wire which was coiled within the right renal pelvis.  Under intermittent fluoroscopic guidance, the nephrostomy was exchanged for a 9-French vascular sheath which with the use of a Kumpe catheter was advanced to the level of the distal left ureter.   Contrast injection was performed of the distal aspect of the left ureter demonstrating a moderate length irregular occlusion involving the distal aspect of the left ureter.  With the use of a regular Glidewire, a common catheter was manipulated into the urinary bladder, however contrast injection demonstrated marked irregularity of the left side of the urinary bladder wall which was thus deemed inappropriate for a ureteral stent placement.  As such, the 9-French vascular sheath was exchanged for a new 10- [place] nephrostomy catheter which was coiled locked within the left renal pelvis.  A spot fluoroscopic image was obtained.  The catheter exit site was secured with interrupted suture.  The catheter was reconnected to a drainage bag.  The patient tolerated the above procedures well without immediate postprocedural  complication.  Findings:  Right-sided antegrade nephrostogram demonstrates moderate to severe dilatation of the right renal collecting system and dilatation of the right ureter to the level of the urinary bladder.  Contrast does passed through the previously placed right-sided double-J ureteral stent, however the right renal collecting system is noted to be markedly patulous.  As such, the right-sided nephrostomy catheter was capped for a trial of internalization.  Left-sided antegrade nephrostogram demonstrates a moderate length irregular occlusion involving the distal aspect of the left ureter. This area of occlusion was successfully traversed however contrast injection within the urinary bladder demonstrates marked irregular filling of the left side of the urinary bladder secondary to the patient's known bladder carcinoma.  As such, it was felt that placement of a ureteral stent which surely fail.  As such, a new 10- [place] nephrostomy was coiled and locked within the left renal pelvis.  The left side nephrostomy was reconnected to a drainage bag.  Impression:  1.  Successful fluoroscopic guided exchange of right sided 10.2 French percutaneous nephrostomy catheter.  As right-sided antegrade nephrostogram demonstrates moderate to severe right-sided pelvocaliectasis and ureterectasis despite the patency of the right- sided ureteral stent, the right sided nephrostomy was capped for an attempted trial of internalization prior to removal of the percutaneous access.  2.  Successful traversal of the moderate length irregular occlusion involving the distal aspect of the left ureter however the majority of the left lateral wall of the urinary bladder was noted to be replaced by an irregular bladder tumor.  As such, ureteral stent placement was deemed inappropriate. 3.  Successful fluoroscopic guided exchange of a left-sided 10- French nephrostomy catheter.  The left sided nephrostomy was connected to a drainage bag.    Original Report Authenticated By: Tacey Ruiz, MD   Ir Nephrostogram Left  11/08/2012   *RADIOLOGY REPORT*  Indication: History of bladder cancer, post bilateral percutaneous nephrostomy placement and left-sided right-sided double-J ureteral catheter placement.  Please perform right-sided antegrade nephrostogram to evaluate for persistent need of right-sided  nephrostomy.  Please attempt to internalize left sided nephrostomy with placement of a left-sided ureteral stent.  1.  BILATERAL ANTEGRADE NEPHROSTOGRAM 2.  FLUOROSCOPIC GUIDED BILATERAL SIDED PCN EXCHANGE 3.  FAILED ATTEMPTED PERCUTANEOUS PLACEMENT OF LEFT SIDED URETERAL STENT SECONDARY TO LARGE IRREGULAR BLADDER MASS  Comparison: Bilateral ultrasound fluoroscopic guided percutaneous nephrostomy catheter placement - 09/07/2012; right-sided ureteral stent placement and attempted left-sided ureteral stent placement - 09/24/2012  Contrast: A total of 50 ml Omnipaque-300 was administered in the both collecting systems  Fluoroscopy Time: 10 minutes, 12 seconds.  Complications: None immediate  Technique:  Informed written consent was obtained from the patient after a discussion of the risks, benefits and alternatives to treatment. Questions regarding the procedure were encouraged and answered.  A timeout was performed prior to the initiation of the procedure.  The bilateral flanks and external portions of the existing nephrostomy catheters were prepped and draped in the usual sterile fashion.  A sterile drape was applied covering the operative field. Maximum barrier sterile technique with sterile gowns and gloves were used for the procedure.  A timeout was performed prior to the initiation of the procedure.  Beginning with the right-sided nephrostomy, a right-sided antegrade nephrostogram was performed.  The existing nephrostomy catheter was cut and cannulated with a Benson wire which was coiled within the renal pelvis.  Under intermittent fluoroscopic guidance,  the existing nephrostomy catheter was exchanged for a new 10.2 [place] all-purpose drainage catheter.  Contrast injection confirmed appropriate positioning within the renal pelvis and a post exchange fluoroscopic image was obtained.  The catheter was locked and secured to the skin with a suture.  A dressing was placed.  Attention was now paid towards the left sided nephrostomy.  Limited contrast injection was performed demonstrating appropriate positioning of the left-sided nephrostomy coiled within the persistently dilated left sided renal pelvis.  The external portion of the left nephrostomy was cut and cannulated with a Benson wire which was coiled within the right renal pelvis.  Under intermittent fluoroscopic guidance, the nephrostomy was exchanged for a 9-French vascular sheath which with the use of a Kumpe catheter was advanced to the level of the distal left ureter.   Contrast injection was performed of the distal aspect of the left ureter demonstrating a moderate length irregular occlusion involving the distal aspect of the left ureter.  With the use of a regular Glidewire, a common catheter was manipulated into the urinary bladder, however contrast injection demonstrated marked irregularity of the left side of the urinary bladder wall which was thus deemed inappropriate for a ureteral stent placement.  As such, the 9-French vascular sheath was exchanged for a new 10- [place] nephrostomy catheter which was coiled locked within the left renal pelvis.  A spot fluoroscopic image was obtained.  The catheter exit site was secured with interrupted suture.  The catheter was reconnected to a drainage bag.  The patient tolerated the above procedures well without immediate postprocedural complication.  Findings:  Right-sided antegrade nephrostogram demonstrates moderate to severe dilatation of the right renal collecting system and dilatation of the right ureter to the level of the urinary bladder.  Contrast does passed  through the previously placed right-sided double-J ureteral stent, however the right renal collecting system is noted to be markedly patulous.  As such, the right-sided nephrostomy catheter was capped for a trial of internalization.  Left-sided antegrade nephrostogram demonstrates a moderate length irregular occlusion involving the distal aspect of the left ureter. This area of occlusion was successfully traversed however  contrast injection within the urinary bladder demonstrates marked irregular filling of the left side of the urinary bladder secondary to the patient's known bladder carcinoma.  As such, it was felt that placement of a ureteral stent which surely fail.  As such, a new 10- [place] nephrostomy was coiled and locked within the left renal pelvis.  The left side nephrostomy was reconnected to a drainage bag.  Impression:  1.  Successful fluoroscopic guided exchange of right sided 10.2 French percutaneous nephrostomy catheter.  As right-sided antegrade nephrostogram demonstrates moderate to severe right-sided pelvocaliectasis and ureterectasis despite the patency of the right- sided ureteral stent, the right sided nephrostomy was capped for an attempted trial of internalization prior to removal of the percutaneous access.  2.  Successful traversal of the moderate length irregular occlusion involving the distal aspect of the left ureter however the majority of the left lateral wall of the urinary bladder was noted to be replaced by an irregular bladder tumor.  As such, ureteral stent placement was deemed inappropriate. 3.  Successful fluoroscopic guided exchange of a left-sided 10- French nephrostomy catheter.  The left sided nephrostomy was connected to a drainage bag.   Original Report Authenticated By: Tacey Ruiz, MD   Ir Nephrostogram Right  11/08/2012   *RADIOLOGY REPORT*  Indication: History of bladder cancer, post bilateral percutaneous nephrostomy placement and left-sided right-sided double-J  ureteral catheter placement.  Please perform right-sided antegrade nephrostogram to evaluate for persistent need of right-sided nephrostomy.  Please attempt to internalize left sided nephrostomy with placement of a left-sided ureteral stent.  1.  BILATERAL ANTEGRADE NEPHROSTOGRAM 2.  FLUOROSCOPIC GUIDED BILATERAL SIDED PCN EXCHANGE 3.  FAILED ATTEMPTED PERCUTANEOUS PLACEMENT OF LEFT SIDED URETERAL STENT SECONDARY TO LARGE IRREGULAR BLADDER MASS  Comparison: Bilateral ultrasound fluoroscopic guided percutaneous nephrostomy catheter placement - 09/07/2012; right-sided ureteral stent placement and attempted left-sided ureteral stent placement - 09/24/2012  Contrast: A total of 50 ml Omnipaque-300 was administered in the both collecting systems  Fluoroscopy Time: 10 minutes, 12 seconds.  Complications: None immediate  Technique:  Informed written consent was obtained from the patient after a discussion of the risks, benefits and alternatives to treatment. Questions regarding the procedure were encouraged and answered.  A timeout was performed prior to the initiation of the procedure.  The bilateral flanks and external portions of the existing nephrostomy catheters were prepped and draped in the usual sterile fashion.  A sterile drape was applied covering the operative field. Maximum barrier sterile technique with sterile gowns and gloves were used for the procedure.  A timeout was performed prior to the initiation of the procedure.  Beginning with the right-sided nephrostomy, a right-sided antegrade nephrostogram was performed.  The existing nephrostomy catheter was cut and cannulated with a Benson wire which was coiled within the renal pelvis.  Under intermittent fluoroscopic guidance, the existing nephrostomy catheter was exchanged for a new 10.2 [place] all-purpose drainage catheter.  Contrast injection confirmed appropriate positioning within the renal pelvis and a post exchange fluoroscopic image was obtained.  The  catheter was locked and secured to the skin with a suture.  A dressing was placed.  Attention was now paid towards the left sided nephrostomy.  Limited contrast injection was performed demonstrating appropriate positioning of the left-sided nephrostomy coiled within the persistently dilated left sided renal pelvis.  The external portion of the left nephrostomy was cut and cannulated with a Benson wire which was coiled within the right renal pelvis.  Under intermittent fluoroscopic guidance, the nephrostomy  was exchanged for a 9-French vascular sheath which with the use of a Kumpe catheter was advanced to the level of the distal left ureter.   Contrast injection was performed of the distal aspect of the left ureter demonstrating a moderate length irregular occlusion involving the distal aspect of the left ureter.  With the use of a regular Glidewire, a common catheter was manipulated into the urinary bladder, however contrast injection demonstrated marked irregularity of the left side of the urinary bladder wall which was thus deemed inappropriate for a ureteral stent placement.  As such, the 9-French vascular sheath was exchanged for a new 10- [place] nephrostomy catheter which was coiled locked within the left renal pelvis.  A spot fluoroscopic image was obtained.  The catheter exit site was secured with interrupted suture.  The catheter was reconnected to a drainage bag.  The patient tolerated the above procedures well without immediate postprocedural complication.  Findings:  Right-sided antegrade nephrostogram demonstrates moderate to severe dilatation of the right renal collecting system and dilatation of the right ureter to the level of the urinary bladder.  Contrast does passed through the previously placed right-sided double-J ureteral stent, however the right renal collecting system is noted to be markedly patulous.  As such, the right-sided nephrostomy catheter was capped for a trial of internalization.   Left-sided antegrade nephrostogram demonstrates a moderate length irregular occlusion involving the distal aspect of the left ureter. This area of occlusion was successfully traversed however contrast injection within the urinary bladder demonstrates marked irregular filling of the left side of the urinary bladder secondary to the patient's known bladder carcinoma.  As such, it was felt that placement of a ureteral stent which surely fail.  As such, a new 10- [place] nephrostomy was coiled and locked within the left renal pelvis.  The left side nephrostomy was reconnected to a drainage bag.  Impression:  1.  Successful fluoroscopic guided exchange of right sided 10.2 French percutaneous nephrostomy catheter.  As right-sided antegrade nephrostogram demonstrates moderate to severe right-sided pelvocaliectasis and ureterectasis despite the patency of the right- sided ureteral stent, the right sided nephrostomy was capped for an attempted trial of internalization prior to removal of the percutaneous access.  2.  Successful traversal of the moderate length irregular occlusion involving the distal aspect of the left ureter however the majority of the left lateral wall of the urinary bladder was noted to be replaced by an irregular bladder tumor.  As such, ureteral stent placement was deemed inappropriate. 3.  Successful fluoroscopic guided exchange of a left-sided 10- French nephrostomy catheter.  The left sided nephrostomy was connected to a drainage bag.   Original Report Authenticated By: Tacey Ruiz, MD     MDM  Spoke with Dr. Archer Asa, interventional radiologist to suggest KUB x-ray. If tube is in place. No need for further intervention acutely. She can be seen by interventional radiology next week. If tube has grossly move, she can be seen tomorrow at 10 AM Diagnosis right flank pain  Doug Sou, MD 12/08/12 [phone]

## 2012-12-07 NOTE — ED Notes (Addendum)
Per pt sent here because her stent in her kidney has become loose. Pt has bilateral nephrostomy tubes.

## 2012-12-07 NOTE — ED Notes (Signed)
Pt appears to be poor historian. Pt reports appx 2 weeks right lower side abdominal pain. Pt states she was sent here by nephrology to "make sure I didn't get my stent loose." Pt reports urinary output has maintained. No redness/swelling noted to nephrostomy sites. Mild pain on palpation to RLQ. Last BM yesterday.

## 2012-12-07 NOTE — ED Notes (Signed)
MD Jacubowitz at bedside.  

## 2012-12-08 NOTE — ED Notes (Signed)
MD Jacubowtiz at bedside.

## 2013-01-15 ENCOUNTER — Encounter (HOSPITAL_COMMUNITY): Payer: Self-pay | Admitting: *Deleted

## 2013-01-15 ENCOUNTER — Emergency Department (HOSPITAL_COMMUNITY)
Admission: EM | Admit: 2013-01-15 | Discharge: 2013-01-16 | Disposition: A | Discharge status: Home / Self Care | Payer: Medicare Other | Attending: Emergency Medicine | Admitting: Emergency Medicine

## 2013-01-15 DIAGNOSIS — J449 Chronic obstructive pulmonary disease, unspecified: Secondary | ICD-10-CM | POA: Insufficient documentation

## 2013-01-15 DIAGNOSIS — J4489 Other specified chronic obstructive pulmonary disease: Secondary | ICD-10-CM | POA: Insufficient documentation

## 2013-01-15 DIAGNOSIS — N390 Urinary tract infection, site not specified: Secondary | ICD-10-CM | POA: Insufficient documentation

## 2013-01-15 DIAGNOSIS — I10 Essential (primary) hypertension: Secondary | ICD-10-CM | POA: Insufficient documentation

## 2013-01-15 DIAGNOSIS — E785 Hyperlipidemia, unspecified: Secondary | ICD-10-CM | POA: Insufficient documentation

## 2013-01-15 DIAGNOSIS — C679 Malignant neoplasm of bladder, unspecified: Secondary | ICD-10-CM | POA: Insufficient documentation

## 2013-01-15 DIAGNOSIS — Z79899 Other long term (current) drug therapy: Secondary | ICD-10-CM | POA: Insufficient documentation

## 2013-01-15 DIAGNOSIS — Z792 Long term (current) use of antibiotics: Secondary | ICD-10-CM | POA: Insufficient documentation

## 2013-01-15 LAB — URINALYSIS, ROUTINE W REFLEX MICROSCOPIC
Glucose, UA: NEGATIVE mg/dL
Glucose, UA: NEGATIVE mg/dL
Ketones, ur: NEGATIVE mg/dL
Protein, ur: 100 mg/dL — AB
Specific Gravity, Urine: 1.015 (ref 1.005–1.030)
pH: 6.5 (ref 5.0–8.0)
pH: 6.5 (ref 5.0–8.0)

## 2013-01-15 LAB — CBC WITH DIFFERENTIAL/PLATELET
Basophils Absolute: 0.1 10*3/uL (ref 0.0–0.1)
Lymphocytes Relative: 9 % — ABNORMAL LOW (ref 12–46)
Lymphs Abs: 1.9 10*3/uL (ref 0.7–4.0)
Neutro Abs: 14.7 10*3/uL — ABNORMAL HIGH (ref 1.7–7.7)
Neutrophils Relative %: 75 % (ref 43–77)
Platelets: 404 10*3/uL — ABNORMAL HIGH (ref 150–400)
RBC: 2.94 MIL/uL — ABNORMAL LOW (ref 3.87–5.11)
RDW: 16.5 % — ABNORMAL HIGH (ref 11.5–15.5)
WBC: 19.7 10*3/uL — ABNORMAL HIGH (ref 4.0–10.5)

## 2013-01-15 LAB — BASIC METABOLIC PANEL
CO2: 24 mEq/L (ref 19–32)
Calcium: 11.3 mg/dL — ABNORMAL HIGH (ref 8.4–10.5)
Chloride: 98 mEq/L (ref 96–112)
Glucose, Bld: 89 mg/dL (ref 70–99)
Potassium: 3.8 mEq/L (ref 3.5–5.1)
Sodium: 130 mEq/L — ABNORMAL LOW (ref 135–145)

## 2013-01-15 LAB — URINE MICROSCOPIC-ADD ON

## 2013-01-15 MED ORDER — MORPHINE SULFATE 4 MG/ML IJ SOLN
4.0000 mg | Freq: Once | INTRAMUSCULAR | Status: AC
  Administered 2013-01-15: 4 mg via INTRAVENOUS
  Filled 2013-01-15: qty 1

## 2013-01-15 MED ORDER — CEPHALEXIN 500 MG PO CAPS
1000.0000 mg | ORAL_CAPSULE | Freq: Once | ORAL | Status: AC
  Administered 2013-01-15: 1000 mg via ORAL
  Filled 2013-01-15: qty 2

## 2013-01-15 MED ORDER — CEPHALEXIN 500 MG PO CAPS: 500.0000 mg | ORAL_CAPSULE | Freq: Four times a day (QID) | ORAL | Status: DC

## 2013-01-15 MED ORDER — ONDANSETRON HCL 4 MG/2ML IJ SOLN
4.0000 mg | Freq: Once | INTRAMUSCULAR | Status: AC
  Administered 2013-01-15: 4 mg via INTRAVENOUS
  Filled 2013-01-15: qty 2

## 2013-01-15 MED ORDER — SODIUM CHLORIDE 0.9 % IV SOLN
INTRAVENOUS | Status: DC
  Administered 2013-01-15 (×2): via INTRAVENOUS

## 2013-01-15 NOTE — ED Notes (Signed)
Right nehpro tube urine resulted at 2043 on 01/15/13

## 2013-01-15 NOTE — ED Notes (Addendum)
Per EMS pt is from a family care home; [facility]. Pts nurse at the facility stated that the drainage coming from her nephrostomy tubes especially in the right side has developed a foul odor, and her urine output has decreased. Also had decreased PO intake and generalized weakness. VS per EMS 132/76; 66, 16, 98%RA; NSR on the monitor.

## 2013-01-15 NOTE — ED Provider Notes (Signed)
CSN: [id number]     Arrival date & time 01/15/13  1805 History   First MD Initiated Contact with Patient 01/15/13 1906     Chief Complaint  Patient presents with  . Weakness  . Bladder Cancer   (Consider location/radiation/quality/duration/timing/severity/associated sxs/prior Treatment) HPI Comments: Colleen Callahan is a 79 y.o. Female who presents for evaluation of generalized weakness. She also "feels dehydrated." She has felt like this before when she had a urinary tract infection. She believes that the urine in her right nephrostomy tube, has a bad odor.  She denies fever, chills, nausea, vomiting, or dizziness. There have been no recent illnesses. There are no known modifying factors.   Patient is a 79 y.o. female presenting with weakness. The history is provided by the patient.  Weakness    Past Medical History  Diagnosis Date  . Hyperlipidemia   . Hypertension   . Cancer     bladder  . COPD (chronic obstructive pulmonary disease)    History reviewed. No pertinent past surgical history. History reviewed. No pertinent family history. History  Substance Use Topics  . Smoking status: Never Smoker   . Smokeless tobacco: Not on file  . Alcohol Use: No   OB History   Grav Para Term Preterm Abortions TAB SAB Ect Mult Living                 Review of Systems  Neurological: Positive for weakness.  All other systems reviewed and are negative.    Allergies  Hctz and Lipitor  Home Medications   Current Outpatient Rx  Name  Route  Sig  Dispense  Refill  . acetaminophen (TYLENOL) 650 MG CR tablet   Oral   Take 650 mg by mouth 4 (four) times daily as needed for pain.         Marland Kitchen amLODipine (NORVASC) 10 MG tablet   Oral   Take 10 mg by mouth daily.         Marland Kitchen atenolol (TENORMIN) 50 MG tablet   Oral   Take 50 mg by mouth every morning.          . Fluticasone-Salmeterol (ADVAIR) 250-50 MCG/DOSE AEPB   Inhalation   Inhale 1 puff into the lungs every 12 (twelve)  hours.         Marland Kitchen guaiFENesin (ROBITUSSIN) 100 MG/5ML liquid   Oral   Take 200 mg by mouth every 6 (six) hours as needed for cough.         . iron polysaccharides (NIFEREX) 150 MG capsule   Oral   Take 150 mg by mouth 2 (two) times daily.         . mirtazapine (REMERON) 15 MG tablet   Oral   Take 15 mg by mouth at bedtime.         . polyethylene glycol (MIRALAX / GLYCOLAX) packet   Oral   Take 17 g by mouth daily.         . ranitidine (ZANTAC) 150 MG tablet   Oral   Take 150 mg by mouth 2 (two) times daily.         . rosuvastatin (CRESTOR) 10 MG tablet   Oral   Take 10 mg by mouth at bedtime.         . cephALEXin (KEFLEX) 500 MG capsule   Oral   Take 1 capsule (500 mg total) by mouth 4 (four) times daily.   28 capsule   0   . sulfamethoxazole-trimethoprim (BACTRIM DS,SEPTRA  DS) 800-160 MG per tablet   Oral   Take 1 tablet by mouth daily.           BP 109/51  Pulse 77  Temp(Src) 97.7 F (36.5 C) (Oral)  Resp 20  SpO2 90% Physical Exam  Nursing note and vitals reviewed. Constitutional: She is oriented to person, place, and time. She appears well-developed and well-nourished.  HENT:  Head: Normocephalic and atraumatic.  Eyes: Conjunctivae and EOM are normal. Pupils are equal, round, and reactive to light.  Neck: Normal range of motion and phonation normal. Neck supple.  Cardiovascular: Normal rate, regular rhythm and intact distal pulses.   Pulmonary/Chest: Effort normal and breath sounds normal. She exhibits no tenderness.  Abdominal: Soft. She exhibits no distension. There is no tenderness. There is no guarding.  Genitourinary:  Nephrostomy tubes into bilateral flanks, appear intact; with the insertion sites, appearing, normal, without discharge, redness or swelling. There is no costovertebral angle tenderness with percussion.  Musculoskeletal: Normal range of motion.  Neurological: She is alert and oriented to person, place, and time. She has  normal strength. She exhibits normal muscle tone.  Skin: Skin is warm and dry.  Psychiatric: She has a normal mood and affect. Her behavior is normal.    ED Course  Procedures (including critical care time) Medications  0.9 %  sodium chloride infusion ( Intravenous Stopped 01/15/13 2200)  morphine 4 MG/ML injection 4 mg (4 mg Intravenous Given 01/15/13 1959)  ondansetron (ZOFRAN) injection 4 mg (4 mg Intravenous Given 01/15/13 1959)  cephALEXin (KEFLEX) capsule 1,000 mg (1,000 mg Oral Given 01/15/13 2311)    Patient Vitals for the past 24 hrs:  BP Temp Temp src Pulse Resp SpO2  01/15/13 2250 109/51 mmHg - - 77 20 90 %  01/15/13 1811 118/54 mmHg 97.7 F (36.5 C) Oral 69 18 95 %      Labs Review Labs Reviewed  CBC WITH DIFFERENTIAL - Abnormal; Notable for the following:    WBC 19.7 (*)    RBC 2.94 (*)    Hemoglobin 8.8 (*)    HCT 26.9 (*)    RDW 16.5 (*)    Platelets 404 (*)    Neutro Abs 14.7 (*)    Lymphocytes Relative 9 (*)    Monocytes Absolute 2.1 (*)    Eosinophils Absolute 1.0 (*)    All other components within normal limits  BASIC METABOLIC PANEL - Abnormal; Notable for the following:    Sodium 130 (*)    BUN 31 (*)    Creatinine, Ser 2.10 (*)    Calcium 11.3 (*)    GFR calc non Af Amer 22 (*)    GFR calc Af Amer 25 (*)    All other components within normal limits  URINALYSIS, ROUTINE W REFLEX MICROSCOPIC - Abnormal; Notable for the following:    APPearance TURBID (*)    Hgb urine dipstick LARGE (*)    Protein, ur 100 (*)    Leukocytes, UA LARGE (*)    All other components within normal limits  URINE MICROSCOPIC-ADD ON - Abnormal; Notable for the following:    Bacteria, UA FEW (*)    Casts HYALINE CASTS (*)    All other components within normal limits  URINALYSIS, ROUTINE W REFLEX MICROSCOPIC - Abnormal; Notable for the following:    APPearance CLOUDY (*)    Hgb urine dipstick LARGE (*)    Protein, ur 100 (*)    Leukocytes, UA LARGE (*)    All other  components within normal limits  URINE MICROSCOPIC-ADD ON - Abnormal; Notable for the following:    Bacteria, UA FEW (*)    All other components within normal limits  URINE CULTURE  URINE CULTURE   Imaging Review No results found.  MDM   1. UTI (urinary tract infection)      Evaluation consistent with urinary tract infection. Doubt pyelonephritis, bacteremia, metabolic instability, or impending vascular collapse. She is tolerating oral liquids in the ED, and is stable for discharge.   Nursing Notes Reviewed/ Care Coordinated, and agree without changes. Applicable Imaging Reviewed.  Interpretation of Laboratory Data incorporated into ED treatment    Plan: Home Medications- Keflex; Home Treatments and Observation- rest, fluids; return here if the recommended treatment, does not improve the symptoms; Recommended follow up- PCP 3-5 days.     Flint Melter, MD 01/15/13 [phone]

## 2013-01-16 NOTE — ED Notes (Signed)
Report called to facility and [facility] contacted for pt transport. 

## 2013-01-17 LAB — URINE CULTURE: Colony Count: 60000

## 2013-01-20 LAB — URINE CULTURE: Special Requests: NORMAL

## 2013-01-21 NOTE — ED Notes (Signed)
Copy of labs faxed to [facility].

## 2013-01-21 NOTE — Progress Notes (Signed)
ED Antimicrobial Stewardship Positive Culture Follow Up   Colleen Callahan is an 79 y.o. female who presented to [facility] on 01/15/2013 with a chief complaint of  Chief Complaint  Patient presents with  . Weakness  . Bladder Cancer    Recent Results (from the past 720 hour(s))  URINE CULTURE     Status: None   Collection Time    01/15/13  8:09 PM      Result Value Range Status   Specimen Description URINE, CATHETERIZED RIGHT NEPHROSTOMY   Final   Special Requests Normal   Final   Culture  Setup Time     Final   Value: 01/16/2013 02:16     Performed at [facility] Count     Final   Value: 60,000 COLONIES/ML     Performed at [facility]   Culture     Final   Value: ESCHERICHIA COLI     Performed at [facility]   Report Status 01/17/2013 FINAL   Final   Organism ID, Bacteria ESCHERICHIA COLI   Final  URINE CULTURE     Status: None   Collection Time    01/15/13  8:09 PM      Result Value Range Status   Specimen Description URINE, CATHETERIZED LEFT NEPHROSTOMY   Final   Special Requests Normal   Final   Culture  Setup Time     Final   Value: 01/16/2013 02:16     Standardized susceptibility testing for this organism is not available.     Performed at [facility] Count     Final   Value: >=100,000 COLONIES/ML     Performed at [facility]     Final   Value: STAPHYLOCOCCUS SPECIES (COAGULASE NEGATIVE)     DIPHTHEROIDS(CORYNEBACTERIUM SPECIES)     Note: RIFAMPIN AND GENTAMICIN SHOULD NOT BE USED AS SINGLE DRUGS FOR TREATMENT OF STAPH INFECTIONS.     Performed at [facility]   Report Status 01/20/2013 FINAL   Final   Organism ID, Bacteria STAPHYLOCOCCUS SPECIES (COAGULASE NEGATIVE)   Final    [x]  Treated with Cephalexin, organism resistant to prescribed antimicrobial []  Patient discharged originally without antimicrobial agent and treatment is now indicated  New antibiotic prescription: Levaquin  250 mg po daily X 3 days.  ED Provider: Raymon Mutton, PA-C   Talbert Cage Poteet 01/21/2013, 10:02 AM  Pharmacist Phone# [phone]

## 2013-02-28 ENCOUNTER — Other Ambulatory Visit (HOSPITAL_COMMUNITY): Payer: Self-pay | Admitting: Interventional Radiology

## 2013-02-28 DIAGNOSIS — C679 Malignant neoplasm of bladder, unspecified: Secondary | ICD-10-CM

## 2013-03-01 ENCOUNTER — Ambulatory Visit (HOSPITAL_COMMUNITY)
Admission: RE | Admit: 2013-03-01 | Discharge: 2013-03-01 | Disposition: A | Discharge status: Home / Self Care | Payer: Medicare Other | Source: Ambulatory Visit | Attending: Interventional Radiology | Admitting: Interventional Radiology

## 2013-03-01 ENCOUNTER — Other Ambulatory Visit (HOSPITAL_COMMUNITY): Payer: Self-pay | Admitting: Interventional Radiology

## 2013-03-01 DIAGNOSIS — C679 Malignant neoplasm of bladder, unspecified: Secondary | ICD-10-CM

## 2013-03-01 DIAGNOSIS — Z436 Encounter for attention to other artificial openings of urinary tract: Secondary | ICD-10-CM | POA: Insufficient documentation

## 2013-03-01 MED ORDER — IOHEXOL 300 MG/ML  SOLN
50.0000 mL | Freq: Once | INTRAMUSCULAR | Status: AC | PRN
  Administered 2013-03-01: 20 mL via INTRAVENOUS

## 2013-03-01 NOTE — Procedures (Signed)
Successful exchange of left nephrostomy tube.  No immediate complication.    

## 2013-03-28 ENCOUNTER — Other Ambulatory Visit (HOSPITAL_COMMUNITY): Payer: Self-pay | Admitting: Urology

## 2013-03-28 DIAGNOSIS — N1330 Unspecified hydronephrosis: Secondary | ICD-10-CM

## 2013-03-29 ENCOUNTER — Other Ambulatory Visit (HOSPITAL_COMMUNITY): Payer: Self-pay | Admitting: Interventional Radiology

## 2013-03-29 ENCOUNTER — Other Ambulatory Visit (HOSPITAL_COMMUNITY): Payer: Self-pay | Admitting: Urology

## 2013-03-29 ENCOUNTER — Other Ambulatory Visit: Payer: Self-pay | Admitting: Radiology

## 2013-03-29 ENCOUNTER — Ambulatory Visit (HOSPITAL_COMMUNITY)
Admission: RE | Admit: 2013-03-29 | Discharge: 2013-03-29 | Disposition: A | Discharge status: Home / Self Care | Payer: Medicare Other | Source: Ambulatory Visit | Attending: Urology | Admitting: Urology

## 2013-03-29 ENCOUNTER — Encounter (HOSPITAL_COMMUNITY): Payer: Self-pay

## 2013-03-29 DIAGNOSIS — J449 Chronic obstructive pulmonary disease, unspecified: Secondary | ICD-10-CM | POA: Insufficient documentation

## 2013-03-29 DIAGNOSIS — C679 Malignant neoplasm of bladder, unspecified: Secondary | ICD-10-CM | POA: Insufficient documentation

## 2013-03-29 DIAGNOSIS — N1330 Unspecified hydronephrosis: Secondary | ICD-10-CM

## 2013-03-29 DIAGNOSIS — R5381 Other malaise: Secondary | ICD-10-CM | POA: Insufficient documentation

## 2013-03-29 DIAGNOSIS — E785 Hyperlipidemia, unspecified: Secondary | ICD-10-CM | POA: Insufficient documentation

## 2013-03-29 DIAGNOSIS — R0602 Shortness of breath: Secondary | ICD-10-CM | POA: Insufficient documentation

## 2013-03-29 DIAGNOSIS — T8389XA Other specified complication of genitourinary prosthetic devices, implants and grafts, initial encounter: Secondary | ICD-10-CM | POA: Insufficient documentation

## 2013-03-29 DIAGNOSIS — Y831 Surgical operation with implant of artificial internal device as the cause of abnormal reaction of the patient, or of later complication, without mention of misadventure at the time of the procedure: Secondary | ICD-10-CM | POA: Insufficient documentation

## 2013-03-29 DIAGNOSIS — R634 Abnormal weight loss: Secondary | ICD-10-CM | POA: Insufficient documentation

## 2013-03-29 DIAGNOSIS — F17200 Nicotine dependence, unspecified, uncomplicated: Secondary | ICD-10-CM | POA: Insufficient documentation

## 2013-03-29 DIAGNOSIS — N135 Crossing vessel and stricture of ureter without hydronephrosis: Secondary | ICD-10-CM | POA: Insufficient documentation

## 2013-03-29 DIAGNOSIS — I10 Essential (primary) hypertension: Secondary | ICD-10-CM | POA: Insufficient documentation

## 2013-03-29 DIAGNOSIS — J4489 Other specified chronic obstructive pulmonary disease: Secondary | ICD-10-CM | POA: Insufficient documentation

## 2013-03-29 DIAGNOSIS — R109 Unspecified abdominal pain: Secondary | ICD-10-CM | POA: Insufficient documentation

## 2013-03-29 LAB — BASIC METABOLIC PANEL WITH GFR
BUN: 27 mg/dL — ABNORMAL HIGH (ref 6–23)
CO2: 25 meq/L (ref 19–32)
Calcium: 8.7 mg/dL (ref 8.4–10.5)
Chloride: 103 meq/L (ref 96–112)
Creatinine, Ser: 1.8 mg/dL — ABNORMAL HIGH (ref 0.50–1.10)
GFR calc Af Amer: 30 mL/min — ABNORMAL LOW
GFR calc non Af Amer: 26 mL/min — ABNORMAL LOW
Glucose, Bld: 80 mg/dL (ref 70–99)
Potassium: 3.3 meq/L — ABNORMAL LOW (ref 3.5–5.1)
Sodium: 138 meq/L (ref 135–145)

## 2013-03-29 LAB — PROTIME-INR
INR: 1.04 (ref 0.00–1.49)
Prothrombin Time: 13.4 s (ref 11.6–15.2)

## 2013-03-29 LAB — CBC
HCT: 31.2 % — ABNORMAL LOW (ref 36.0–46.0)
Hemoglobin: 10 g/dL — ABNORMAL LOW (ref 12.0–15.0)
MCH: 31.8 pg (ref 26.0–34.0)
MCHC: 32.1 g/dL (ref 30.0–36.0)
MCV: 99.4 fL (ref 78.0–100.0)
Platelets: 401 10*3/uL — ABNORMAL HIGH (ref 150–400)
RBC: 3.14 MIL/uL — ABNORMAL LOW (ref 3.87–5.11)
RDW: 17 % — ABNORMAL HIGH (ref 11.5–15.5)
WBC: 19.7 10*3/uL — ABNORMAL HIGH (ref 4.0–10.5)

## 2013-03-29 MED ORDER — CIPROFLOXACIN IN D5W 400 MG/200ML IV SOLN
400.0000 mg | INTRAVENOUS | Status: AC
  Administered 2013-03-29: 400 mg via INTRAVENOUS
  Filled 2013-03-29: qty 200

## 2013-03-29 MED ORDER — IOHEXOL 300 MG/ML  SOLN
50.0000 mL | Freq: Once | INTRAMUSCULAR | Status: AC | PRN
  Administered 2013-03-29: 40 mL

## 2013-03-29 MED ORDER — FENTANYL CITRATE 0.05 MG/ML IJ SOLN
INTRAMUSCULAR | Status: AC | PRN
  Administered 2013-03-29 (×2): 50 ug via INTRAVENOUS

## 2013-03-29 MED ORDER — MIDAZOLAM HCL 2 MG/2ML IJ SOLN
INTRAMUSCULAR | Status: AC
  Filled 2013-03-29: qty 4

## 2013-03-29 MED ORDER — MIDAZOLAM HCL 2 MG/2ML IJ SOLN
INTRAMUSCULAR | Status: AC | PRN
  Administered 2013-03-29 (×2): 1 mg via INTRAVENOUS

## 2013-03-29 MED ORDER — FENTANYL CITRATE 0.05 MG/ML IJ SOLN
INTRAMUSCULAR | Status: AC
  Filled 2013-03-29: qty 4

## 2013-03-29 MED ORDER — SODIUM CHLORIDE 0.9 % IV SOLN: Freq: Once | INTRAVENOUS | Status: DC

## 2013-03-29 NOTE — ED Notes (Signed)
Patient denies pain and is resting comfortably.  

## 2013-03-29 NOTE — H&P (Signed)
Agree 

## 2013-03-29 NOTE — H&P (Signed)
Colleen Callahan is an 79 y.o. female.   Chief Complaint: Pt with Hx Bladder Ca Has had B PCNs placed 08/2012 Has had JJ placed in Rt 09/2012- capped 10/2012 L PCN most recently changed 02/2013  Rt percutaneous nephrostomy was removed (or fell out) while in [facility] 8/14) Pt with new symptoms of Rt flank pain and Urinary congestion Along with very poor output of L PCN since mid October Scheduled now for probable new placement of Rt Percutaneous nephrostomy And L nephrostogram with exchange of PCN  HPI: HLD; bladder ca; HTN; COPD  Past Medical History  Diagnosis Date  . Hyperlipidemia   . Hypertension   . Cancer     bladder  . COPD (chronic obstructive pulmonary disease)     History reviewed. No pertinent past surgical history.  No family history on file. Social History:  reports that she has been smoking.  She does not have any smokeless tobacco history on file. She reports that she does not drink alcohol. Her drug history is not on file.  Allergies:  Allergies  Allergen Reactions  . Hctz [Hydrochlorothiazide] Other (See Comments)    unknown  . Lipitor [Atorvastatin] Other (See Comments)    unknown     (Not in a hospital admission)  Results for orders placed during the hospital encounter of 03/29/13 (from the past 48 hour(s))  CBC     Status: Abnormal   Collection Time    03/29/13  9:29 AM      Result Value Range   WBC 19.7 (*) 4.0 - 10.5 K/uL   RBC 3.14 (*) 3.87 - 5.11 MIL/uL   Hemoglobin 10.0 (*) 12.0 - 15.0 g/dL   HCT 16.1 (*) 09.6 - 04.5 %   MCV 99.4  78.0 - 100.0 fL   MCH 31.8  26.0 - 34.0 pg   MCHC 32.1  30.0 - 36.0 g/dL   RDW 40.9 (*) 81.1 - 91.4 %   Platelets 401 (*) 150 - 400 K/uL   No results found.  Review of Systems  Constitutional: Positive for weight loss. Negative for fever.  Respiratory: Positive for cough and shortness of breath.   Cardiovascular: Negative for chest pain.  Gastrointestinal: Negative for nausea, vomiting and abdominal  pain.  Genitourinary: Positive for flank pain.  Neurological: Positive for weakness.  Psychiatric/Behavioral: Positive for substance abuse.       Smoker    Blood pressure 112/59, pulse 65, temperature 97.8 F (36.6 C), temperature source Oral, resp. rate 18, height 5\' 5"  (1.651 m), weight 145 lb (65.772 kg), SpO2 98.00%. Physical Exam  Constitutional: She is oriented to person, place, and time.  Thin frail  Cardiovascular: Normal rate and regular rhythm.   No murmur heard. Respiratory: Effort normal. She has wheezes.  GI: Soft. Bowel sounds are normal. There is no tenderness.  Musculoskeletal: Normal range of motion.  Neurological: She is alert and oriented to person, place, and time.  Skin:   +existing L PCN- maybe 50 cc yellow urine in bag Not draining well per pt  Rt flank sl painful; no PCN  Psychiatric: She has a normal mood and affect. Her behavior is normal. Judgment and thought content normal.     Assessment/Plan Bladder Ca Existing L PCN- not functioning well Existing R JJ- R PCN out since 8/14-new sxs of urinary congestion Scheduled for probable Rt PCN placement and L nephrostogram with exchange of PCN Pt and dtr in law aware of procedure benefits and risks and agreeable to proceed Consent  signed and in chart  Alyana Kreiter A 03/29/2013, 10:29 AM

## 2013-03-29 NOTE — Procedures (Signed)
Procedure:  Exchange of left nephrostomy tube; placement of new right nephrostomy tube Findings:  Left PCN largely out of collecting system with left hydro noted.  New 10 Fr tube placed and positioned in left renal pelvis. [place] confirms severe right hydro.  New 10 Fr PCN placed to renal pelvis.  Both PCN's to gravity drainage.

## 2013-03-29 NOTE — Progress Notes (Signed)
PRIOR TO D/C EMPTIED 100CC FROM LEFT NEPHROSTOMY TUBE AND 200CC FROM RIGHT NEPHROSTOMY TUBE

## 2013-05-10 ENCOUNTER — Inpatient Hospital Stay (HOSPITAL_COMMUNITY): Admission: RE | Admit: 2013-05-10 | Payer: Medicare Other | Source: Ambulatory Visit

## 2013-05-24 ENCOUNTER — Other Ambulatory Visit (HOSPITAL_COMMUNITY): Payer: Self-pay | Admitting: Interventional Radiology

## 2013-05-24 ENCOUNTER — Ambulatory Visit (HOSPITAL_COMMUNITY)
Admission: RE | Admit: 2013-05-24 | Discharge: 2013-05-24 | Disposition: A | Discharge status: Home / Self Care | Payer: Medicare Other | Source: Ambulatory Visit | Attending: Interventional Radiology | Admitting: Interventional Radiology

## 2013-05-24 DIAGNOSIS — C679 Malignant neoplasm of bladder, unspecified: Secondary | ICD-10-CM | POA: Insufficient documentation

## 2013-05-24 DIAGNOSIS — N1330 Unspecified hydronephrosis: Secondary | ICD-10-CM

## 2013-05-24 DIAGNOSIS — Z436 Encounter for attention to other artificial openings of urinary tract: Secondary | ICD-10-CM | POA: Insufficient documentation

## 2013-05-24 MED ORDER — IOHEXOL 300 MG/ML  SOLN
50.0000 mL | Freq: Once | INTRAMUSCULAR | Status: AC | PRN
Start: 2013-05-24 — End: 2013-05-24
  Administered 2013-05-24: 20 mL

## 2013-05-24 NOTE — Procedures (Signed)
Exchange of bilateral nephrostomy tubes.  Right nephrostomy was partially occluded and there was right hydronephrosis.  Foul smelling cloudy fluid removed from right nephrostomy.  Will contact patient's urologist about existing right ureteral stent and treatment for possible UTI.

## 2013-05-26 LAB — URINE CULTURE: Colony Count: >=100000

## 2013-06-23 DEATH — deceased

## 2013-07-05 ENCOUNTER — Inpatient Hospital Stay (HOSPITAL_COMMUNITY): Admission: RE | Admit: 2013-07-05 | Payer: Medicare Other | Source: Ambulatory Visit

## 2014-01-16 IMAGING — XA IR INTRO URET CATH PERC *L*
1 series · 15 of 24 positions shown · non-contrast
Comparison: Imaging during nephrostomy tube placement on
09/07/2012.

CLINICAL DATA: Bilateral ureteral obstruction secondary to bladder
tumor and status post bilateral percutaneous nephrostomy tube
placement on 09/07/2012.  The patient presents for nephrostogram
studies and possible ureteral stenting.

1.  LEFT NEPHROSTOGRAM.
2.  LEFT PERCUTANEOUS NEPHROSTOMY TUBE EXCHANGE.
3.  RIGHT NEPHROSTOGRAM.
4.  RIGHT URETERAL STENT PLACEMENT.
5.  RIGHT PERCUTANEOUS NEPHROSTOMY TUBE EXCHANGE.

[Series 1: run · 15 of 24 slices shown]
[im 1/24]
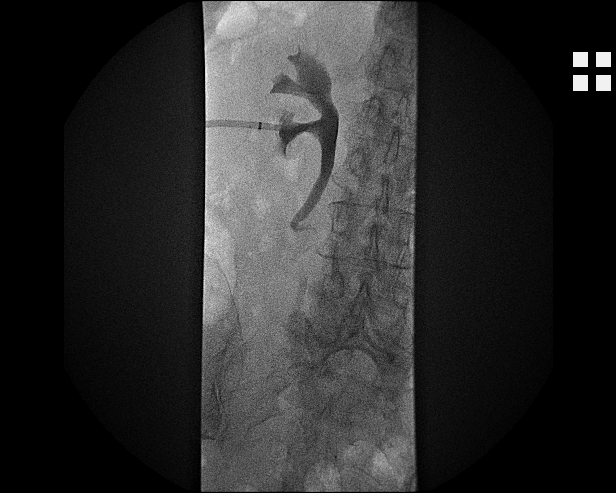
[im 3/24]
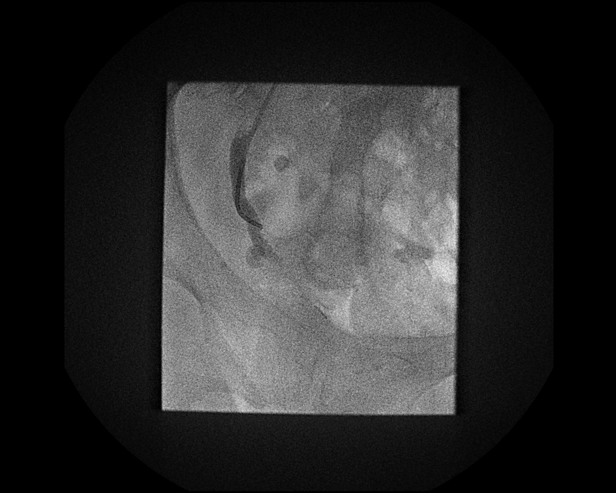
[im 5/24]
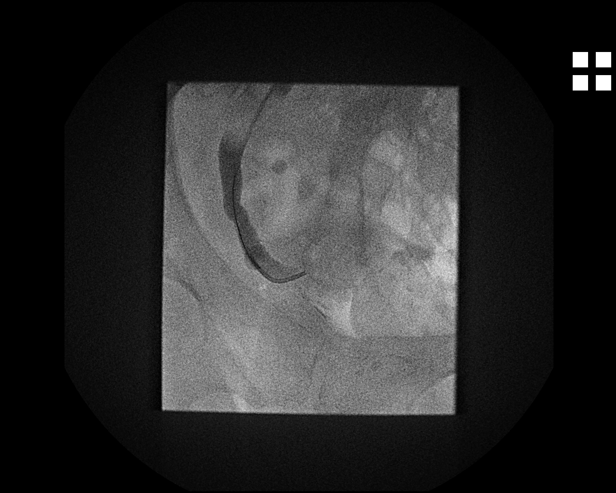
[im 6/24]
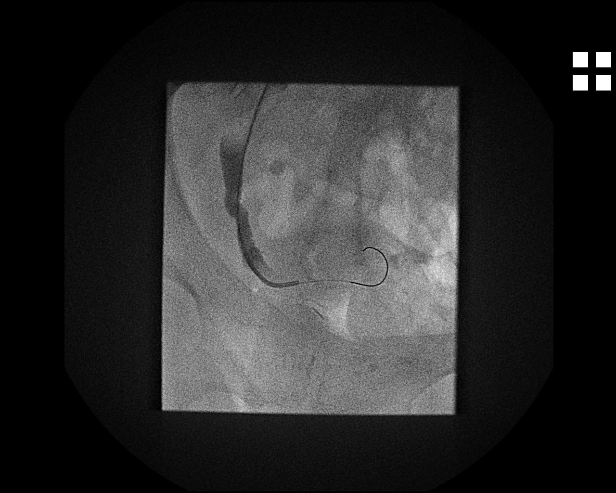
[im 8/24]
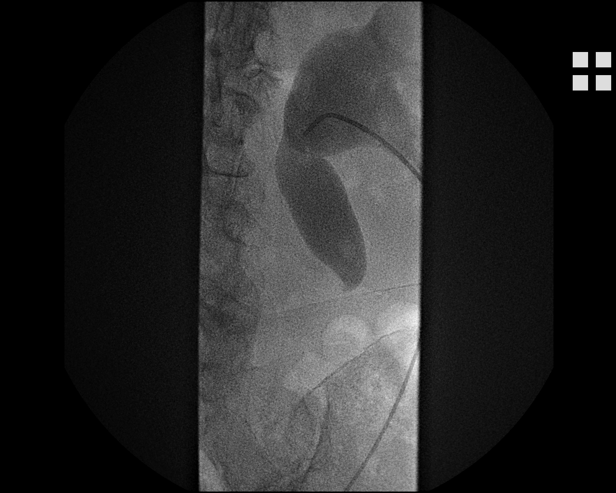
[im 9/24]
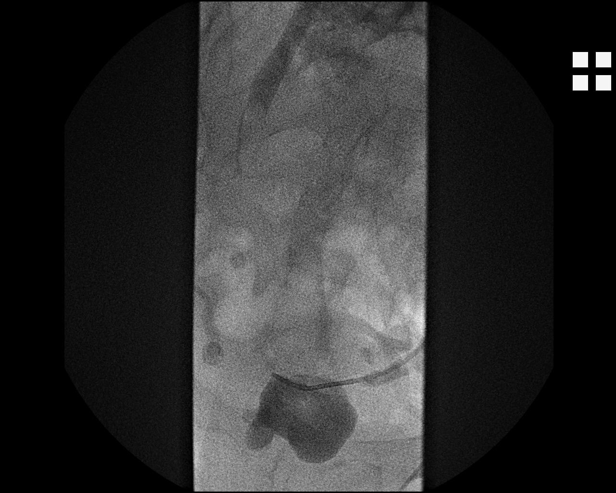
[im 11/24]
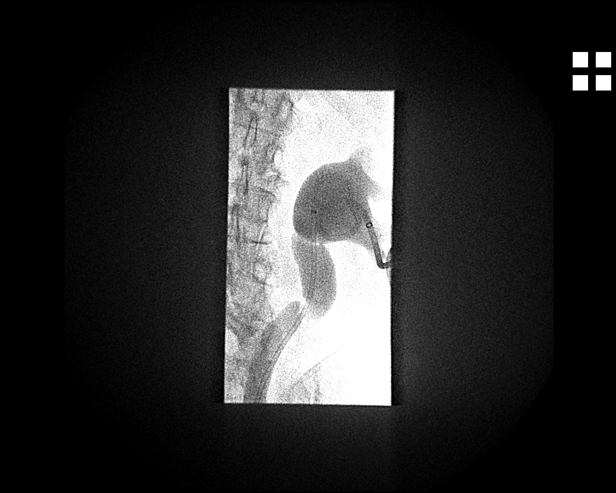
[im 13/24]
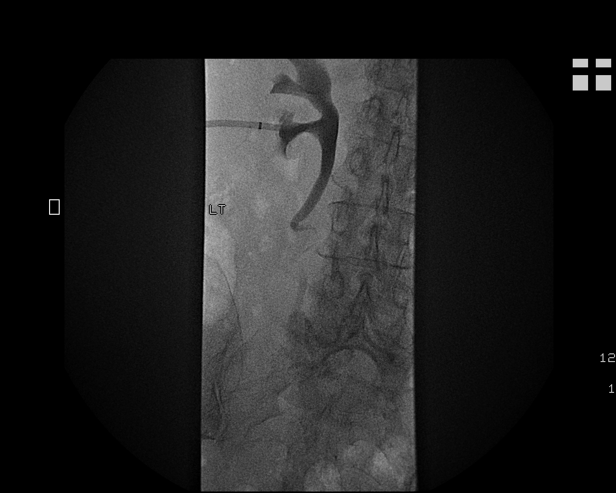
[im 14/24]
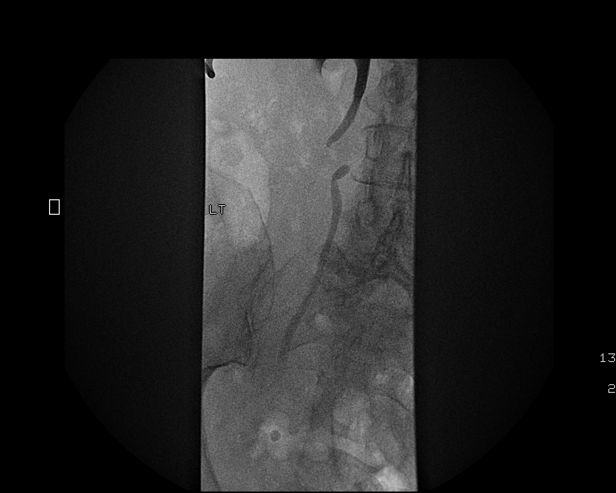
[im 16/24]
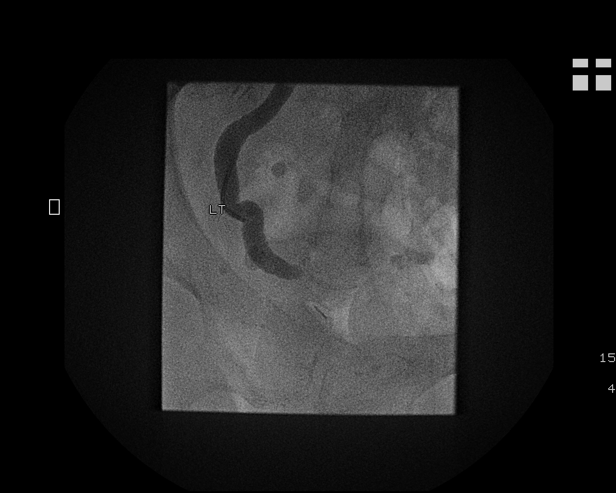
[im 17/24]
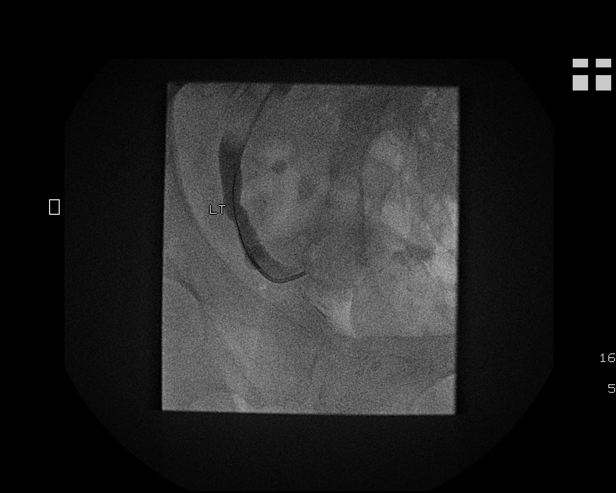
[im 19/24]
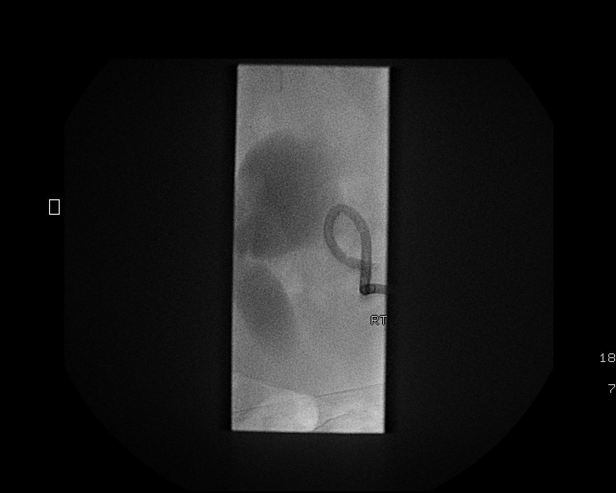
[im 21/24]
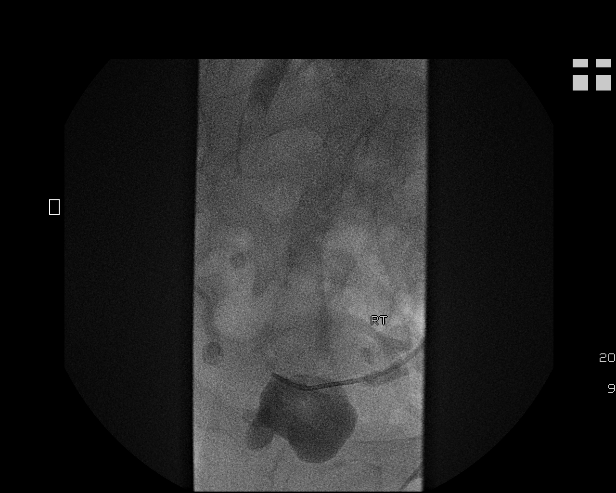
[im 22/24]
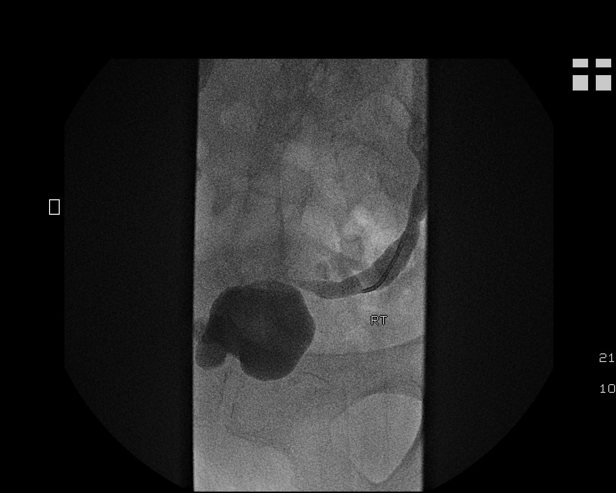
[im 24/24]
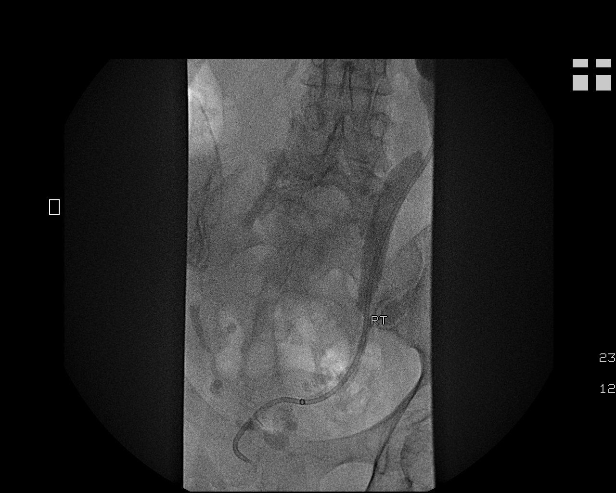

[15 of 24 positions shown; findings below may reference images not displayed]

Sedation: 2.0 mg IV Versed; 100 mcg IV Fentanyl.

Total Moderate Sedation Time: 48 minutes.

Contrast:  25 ml Cmnipaque-4JJ

Additional Medications:  400 mg IV Cipro.  Ciprofloxacin was given
within two hours of incision.

Fluoroscopy Time: 8 minutes, 48 seconds.

Procedure:  The procedure, risks, benefits, and alternatives were
explained to the patient.  Questions regarding the procedure were
encouraged and answered.  The patient understands and consents to
the procedure.

The nephrostomy tube and surrounding skin was prepped with Betadine
in a sterile fashion, and a sterile drape was applied covering the
operative field.  A sterile gown and sterile gloves were used for
the procedure. Local anesthesia was provided with 1% Lidocaine.

The preexisting left nephrostomy tube was injected with contrast
material.  Fluoroscopic spot images were obtained of the collecting
system and ureter.  Additional injection of the ureter was
performed after removal of the nephrostomy tube over a guidewire
and insertion of a 5-French catheter into the ureter.

The attempt was made to the pass a guidewire through the distal
ureter and into the bladder.  A hydrophilic guide wire was passed
into the bladder lumen.  Attempt was made to advance a 5-French
catheter over this wire.  Ultimately, a new 10-French nephrostomy
tube was exchanged over a guidewire informed at the level of the
left renal pelvis.  This catheter was injected with contrast
material to confirm position and connected to a gravity drainage
bag.  The nephrostomy tube was secured at the skin with a Prolene
retention suture.

The right nephrostomy tube was then injected with contrast material
and fluoroscopic spot images obtained.  The catheter was cut and
removed over a guidewire.  A 5-French Kumpe catheter was advanced
into the ureter and additional contrast injection performed.

A guidewire was advanced into the bladder.  The wire was kinked to
estimate appropriate length of ureteral stent.  An 8-French by 22
cm ureteral stent was then advanced over an additional guidewire.
The distal portion was formed at the level of the bladder.
Proximal portion was then formed at the level of the right renal
collecting system.  The stent pusher was then injected with
contrast material to evaluate drainage under fluoroscopy. A new 10-
French nephrostomy tube was then placed over a guidewire and formed
in the right renal pelvis.

Complications:  None
FINDINGS: Left nephrostogram demonstrates a decompressed renal
collecting system without significant hydronephrosis.  The ureter
is tortuous and was able to be straightened after guide wire and
catheter placement.  Injection of the distal ureter shows
obstruction at the ureterovesicle junction with no contrast
entering the bladder.  The obstruction was able to be crossed with
a hydrophilic guide wire.  However, a 5-French tapered catheter
would not pass over the guide wire into the bladder lumen and
therefore ureteral stent placement was not possible.  A new
nephrostomy tube was formed at the level of the renal pelvis and
connected to gravity drainage.

The right nephrostogram demonstrates significant residual
hydronephrosis and hydroureter.  Injection at the level of the
distal ureter shows displacement of the distal ureter superiorly
relative to an irregular and abnormal opacified bladder lumen. The
opacified bladder lumen is small and irregular in shape, likely
reflecting extensive involvement by tumor.  Contrast does enter the
bladder and the distal ureter was able to be crossed with a guide
wire and catheter, allowing placement of a ureteral stent.  The
proximal portion of the stent was formed at the level of the renal
pelvis and the distal portion in the bladder.  Given very abnormal
appearance to the bladder lumen, decision was made to replace the
right-sided nephrostomy tube and currently cap the tube to trial
internal drainage.  The patient's caregiver was given a new gravity
drainage bag as well, to reconnect the right nephrostomy tube to
gravity drainage should there be any signs of symptomatic right
renal obstruction.
IMPRESSION: 1.  Distal left ureteral obstruction which was unable to be crossed
with a catheter.  Ureteral stent placement was not possible today
and a new 10-French nephrostomy tube was formed in the renal pelvis
and attached to a gravity drainage bag.
2.  Distal right ureteral stricture which was able to be crossed,
allowing placement of an 8-French by 22 cm ureteral stent extending
from the renal pelvis to the bladder.  Given evidence of persistent
right-sided hydronephrosis with initial nephrostogram injection and
a very abnormal fluoroscopic appearance to the bladder lumen, a
nephrostomy tube was replaced and capped to trial drainage via the
right ureteral stent.

## 2014-01-31 IMAGING — XA IR BILIARY CATHETER EXCHANGE
1 series · 2 of 2 positions shown · non-contrast
Comparison: none

Clinical Data/Indication: FRACTURED RIGHT PERCUTANEOUS NEPHROSTOMY
CATHETER

PERC TUBE CHG W/CM
Fluoroscopy Time: 54 seconds.
Procedure: The procedure, risks, benefits, and alternatives were
explained to the patient. Questions regarding the procedure were
encouraged and answered. The patient understands and consents to
the procedure.
The back was prepped with betadine in a sterile fashion, and a
sterile drape was applied covering the operative field. A sterile
gown and sterile gloves were used for the procedure.
Contrast was injected into the existing right nephrostomy and
imaging was obtained.  It was cut and exchanged over a Benson wire
for a new 10-French nephrostomy catheter.  It was coiled in the
renal pelvis.  It was string fixed then sewn to the skin.  Contrast
was injected.

[Series 1: run · 2 of 2 slices shown]
[im 1/2]
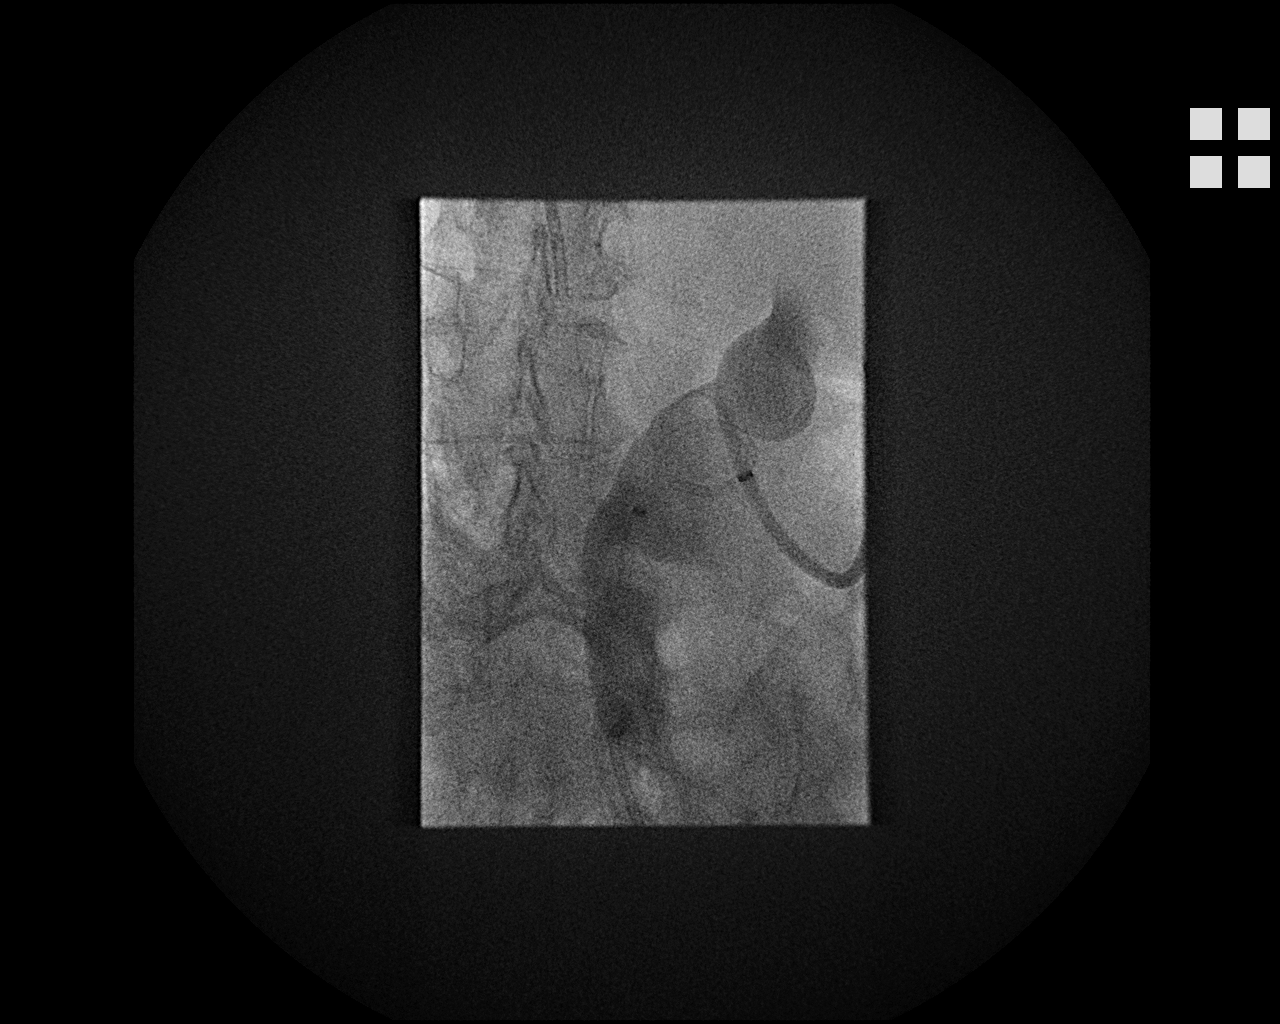
[im 2/2]
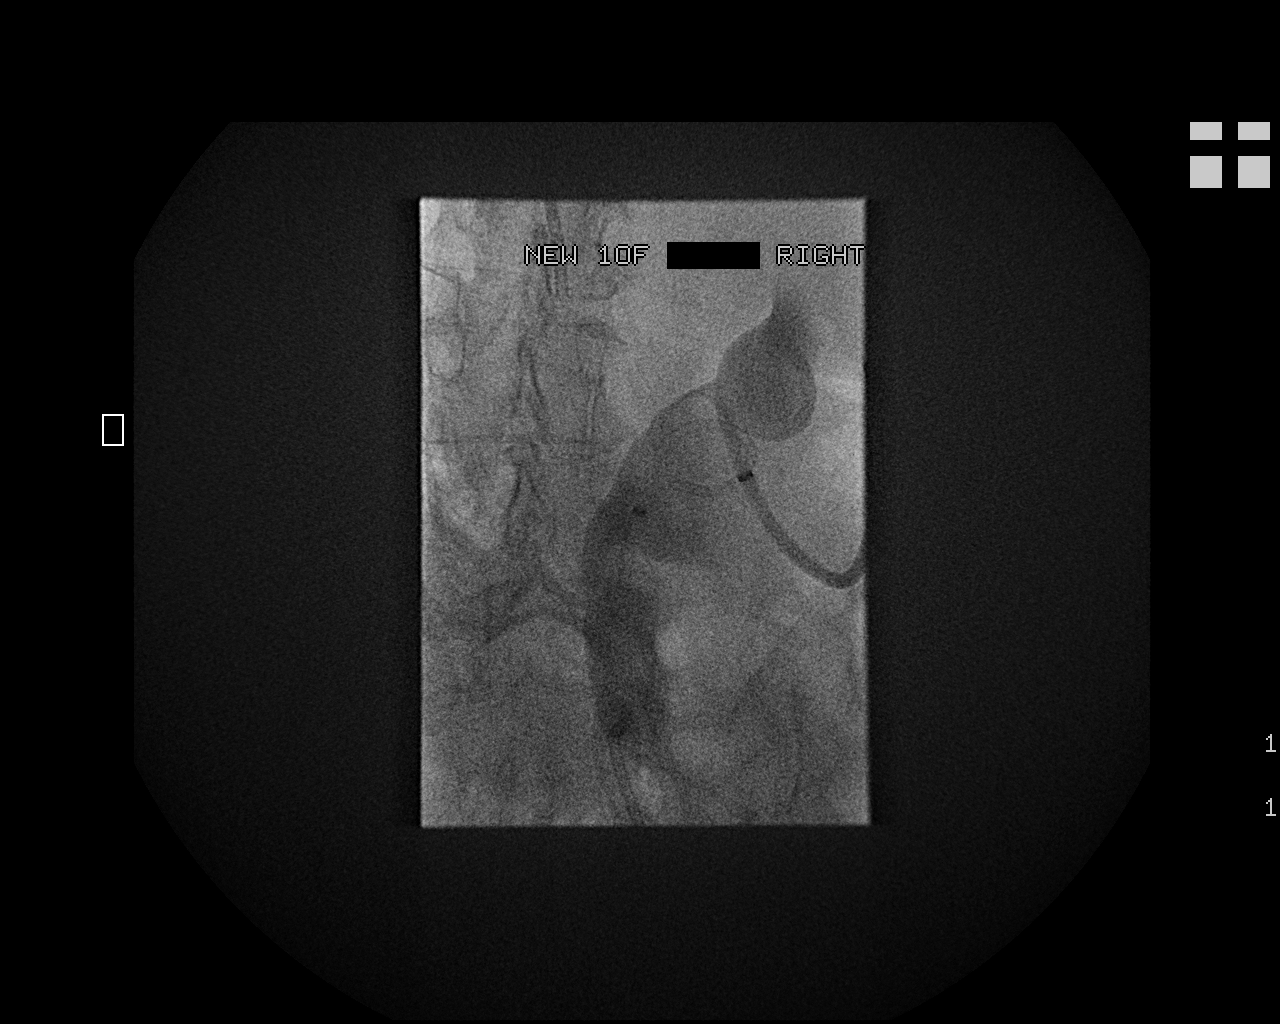

[2 of 2 positions shown; findings below may reference images not displayed]

FINDINGS: Imaging documents exchange of the right percutaneous
nephrostomy catheter.  The tip is coiled in the renal pelvis.  A
right double-J ureteral stent is in place with its superior end
coiled in the renal pelvis.

Complications: None.
IMPRESSION: Successful 10-French right percutaneous nephrostomy catheter
exchange.

## 2014-06-23 IMAGING — XA IR BILIARY CATHETER EXCHANGE
1 series · 6 of 6 positions shown · non-contrast
Comparison: none

CLINICAL DATA: History of left nephrostomy tube and decreased
output.

[Series 1: run · 6 of 6 slices shown]
[im 1/6]
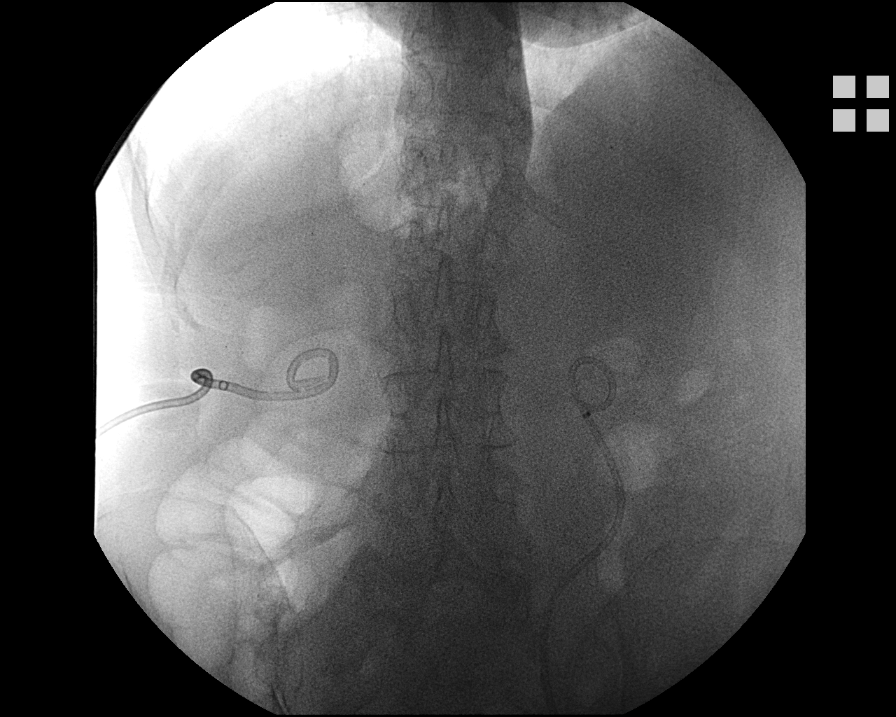
[im 2/6]
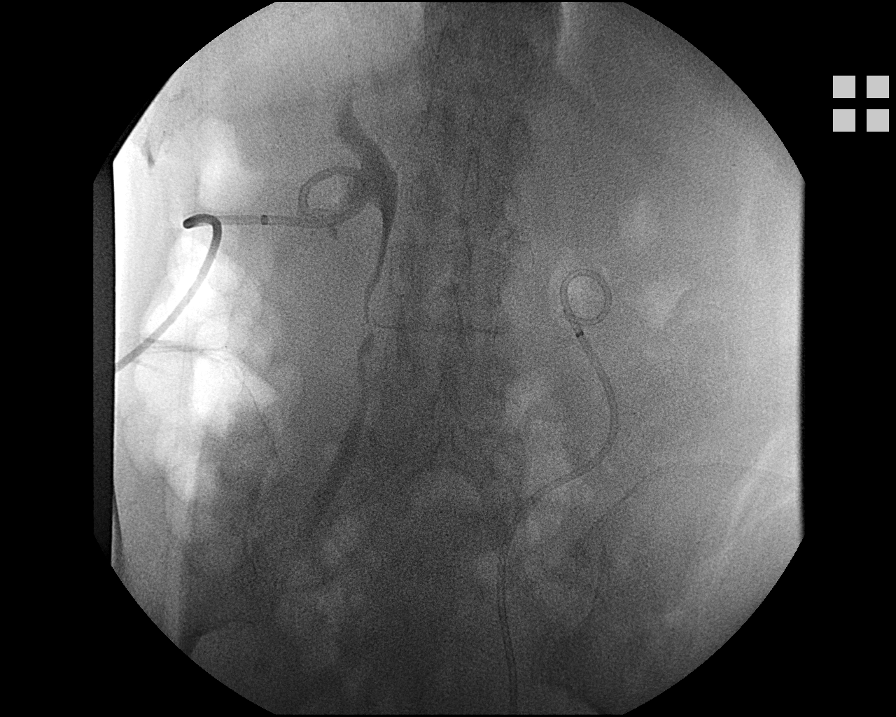
[im 3/6]
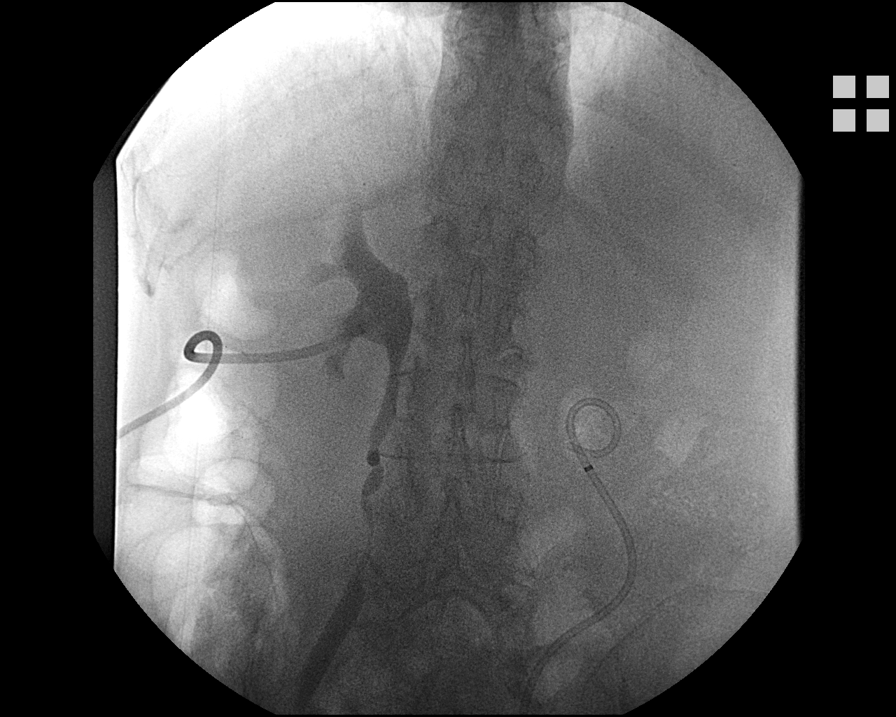
[im 4/6]
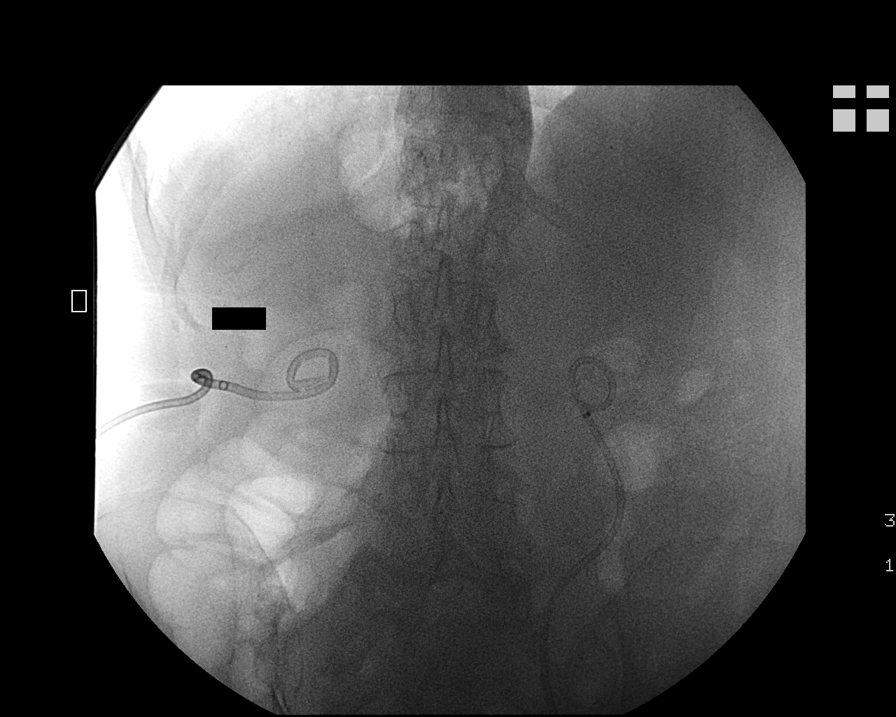
[im 5/6]
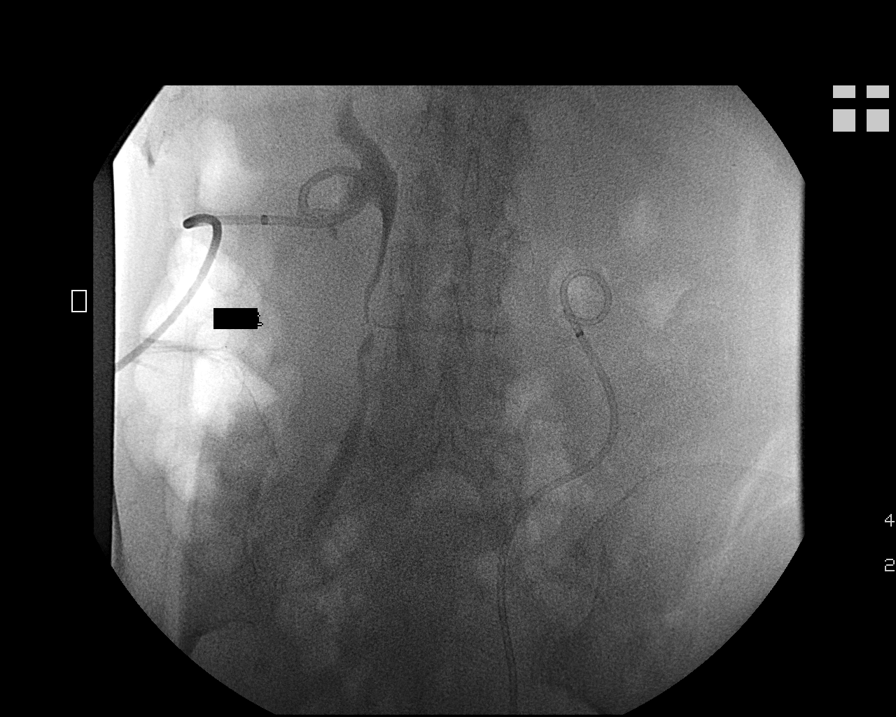
[im 6/6]
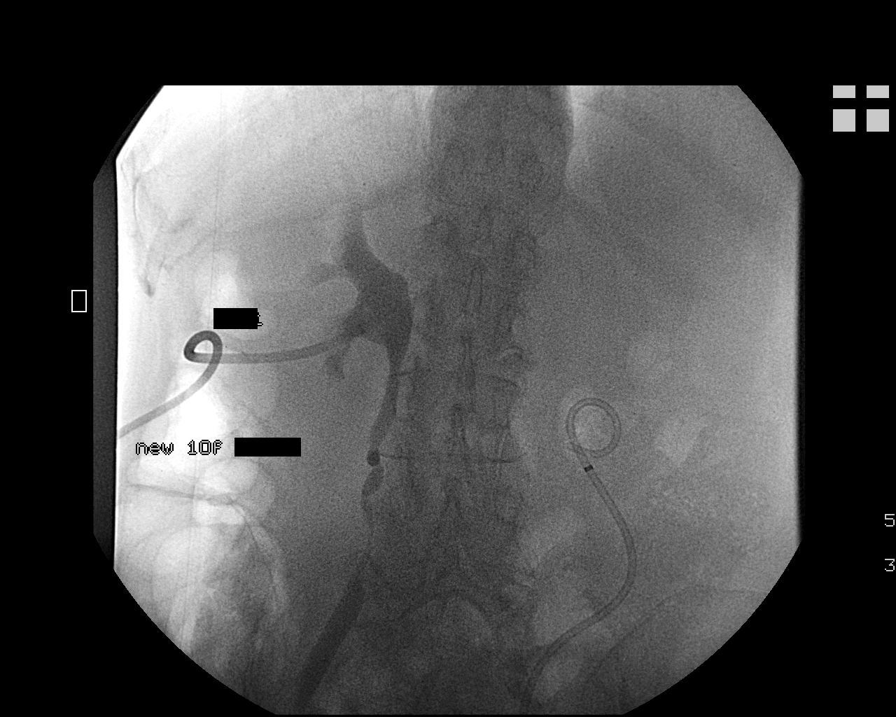

[6 of 6 positions shown; findings below may reference images not displayed]

EXAM:
EXCHANGE OF LEFT NEPHROSTOMY TUBE WITH FLUOROSCOPY

MEDICATIONS:
None

ANESTHESIA/SEDATION:
Moderate sedation time: None

FLUOROSCOPY TIME:  48 seconds

PROCEDURE:
The procedure was explained to the patient. The risks and benefits
of the procedure were discussed and the patient's questions were
addressed. Informed consent was obtained from the patient. The
patient was placed prone on the interventional table. The left flank
was prepped and draped in sterile fashion. Maximal barrier sterile
technique was utilized including caps, mask, sterile gowns, sterile
gloves, sterile drape, hand hygiene and skin antiseptic. Contrast
was injected through the nephrostomy tube. The catheter was cut and
removed over a Bentson wire. A new 10.2 French multipurpose drain
was advanced over the wire and placed in the left renal pelvis. The
skin around the catheter was anesthetized with 1% lidocaine.
Catheter was sutured to the skin with Prolene suture. A dressing was
placed over the catheter. Catheter was attached to gravity bag.
Fluoroscopic images were taken and saved for this procedure.

COMPLICATIONS:
None
FINDINGS: Patient had a left nephrostomy tube that was partially dislodged.
The radiopaque marker was outside of the collecting system. New
nephrostomy tube was successfully advanced into the renal pelvis.
Patient also has a right ureteral stent.
IMPRESSION: Successful exchange of the left nephrostomy tube without
complication.

## 2014-07-21 IMAGING — XA IR PERC NEPHROSTOMY*R*
1 series · 8 of 8 positions shown · non-contrast
Comparison: Prior tube change 03/01/2013.

CLINICAL DATA: Bladder cancer and bilateral ureteral obstruction.
Ultrasound has demonstrated significant bilateral hydronephrosis.
There is an indwelling left nephrostomy tube which is not draining
well. The patient requires right percutaneous nephrostomy due to a
malfunctioning ureteral stent.

[Series 1: run · 8 of 8 slices shown]
[im 1/8]
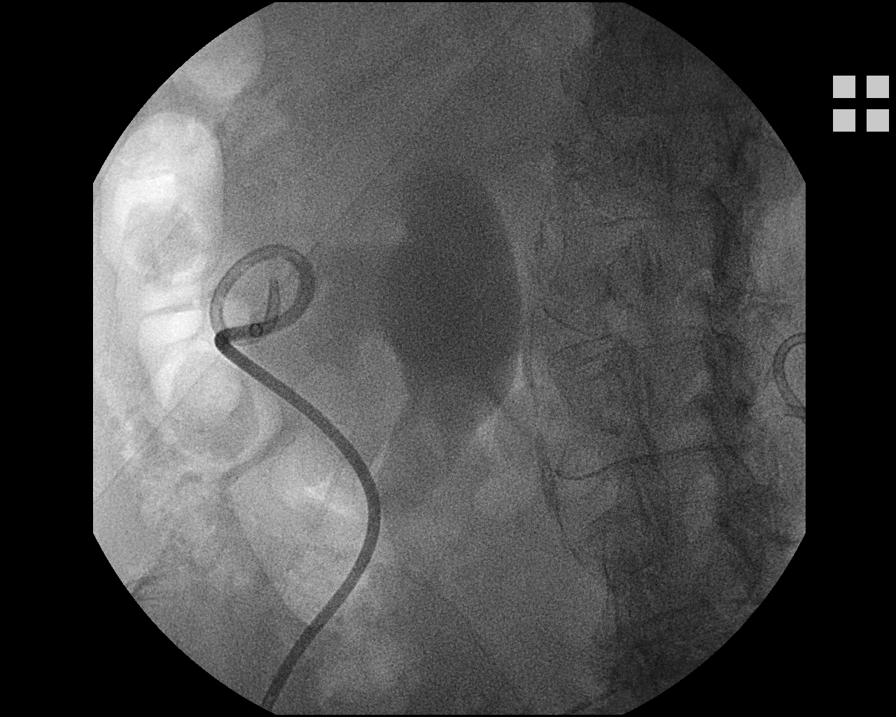
[im 2/8]
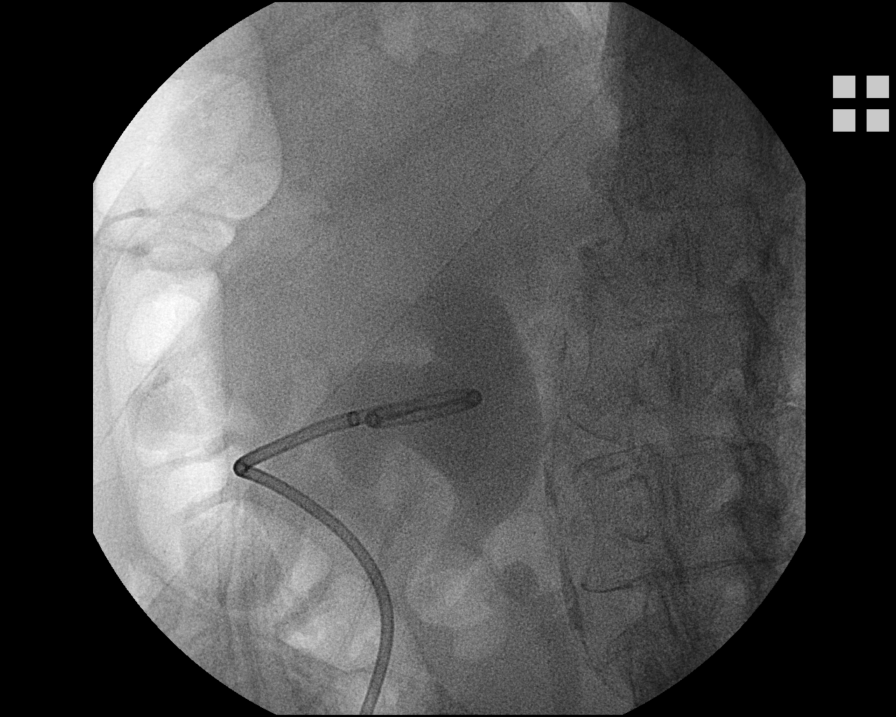
[im 3/8]
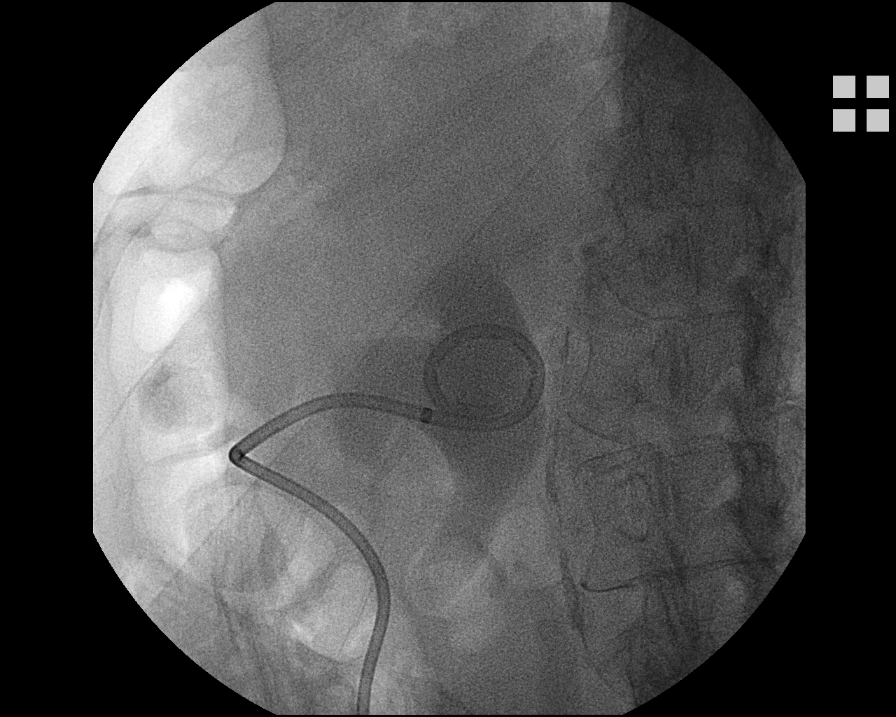
[im 4/8]
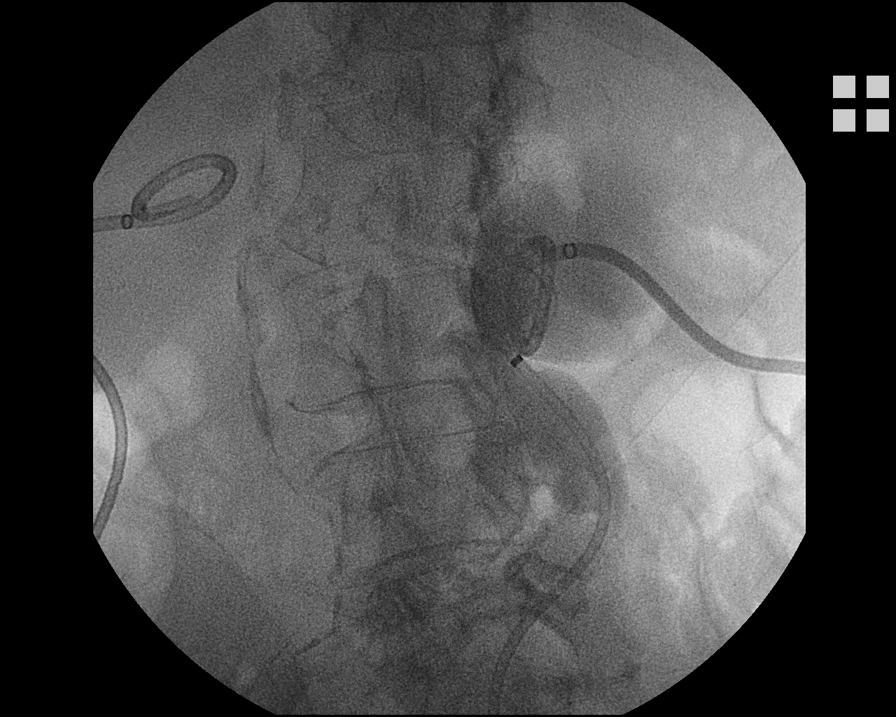
[im 5/8]
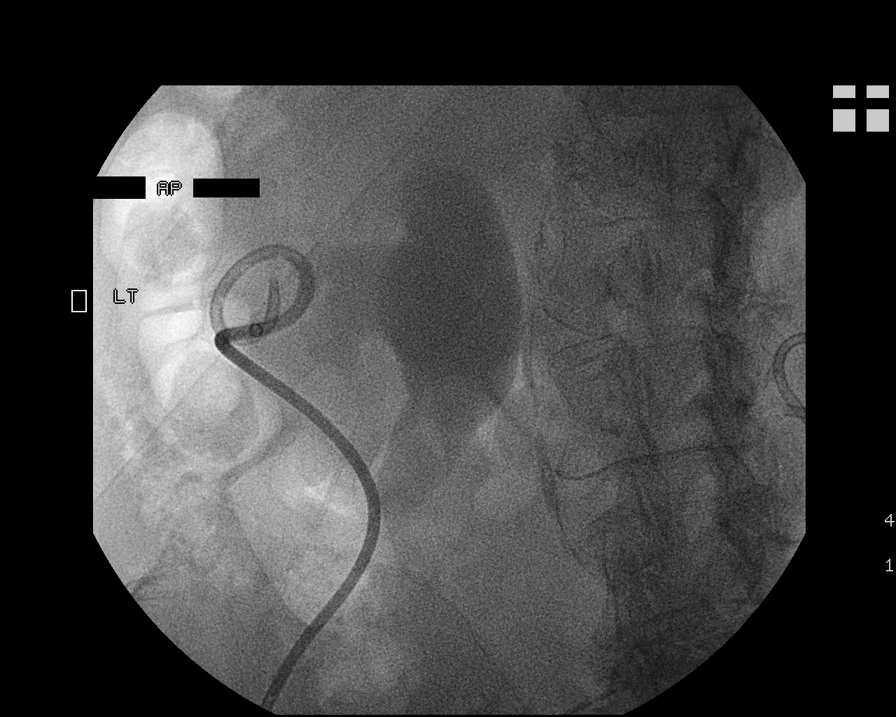
[im 6/8]
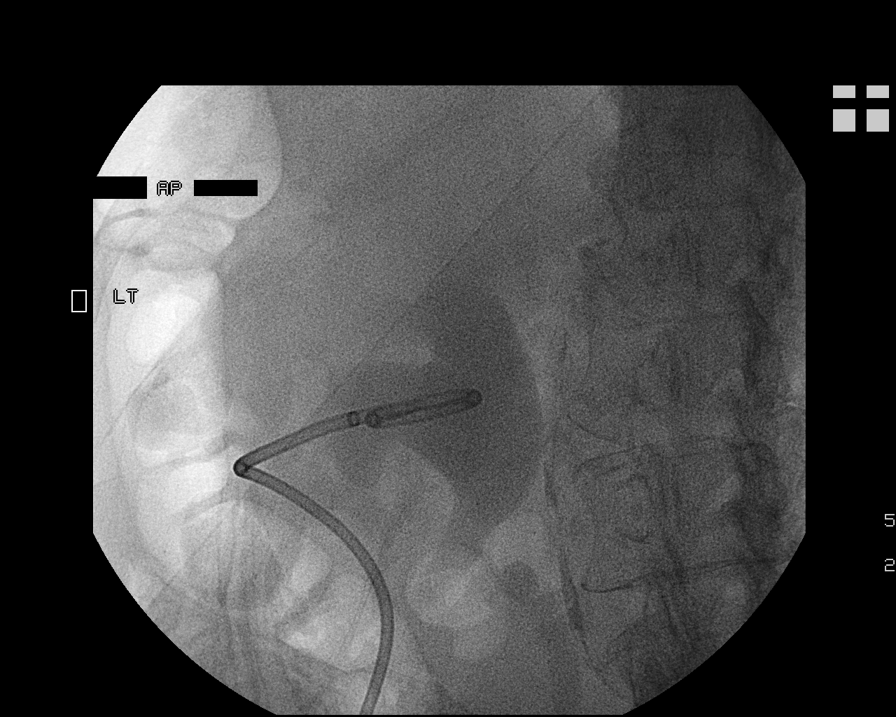
[im 7/8]
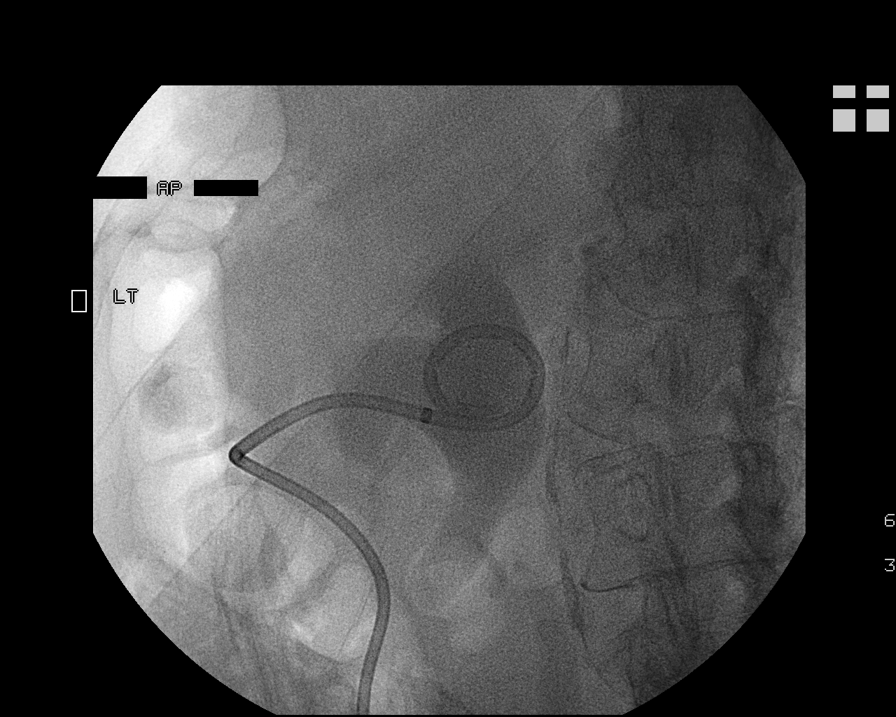
[im 8/8]
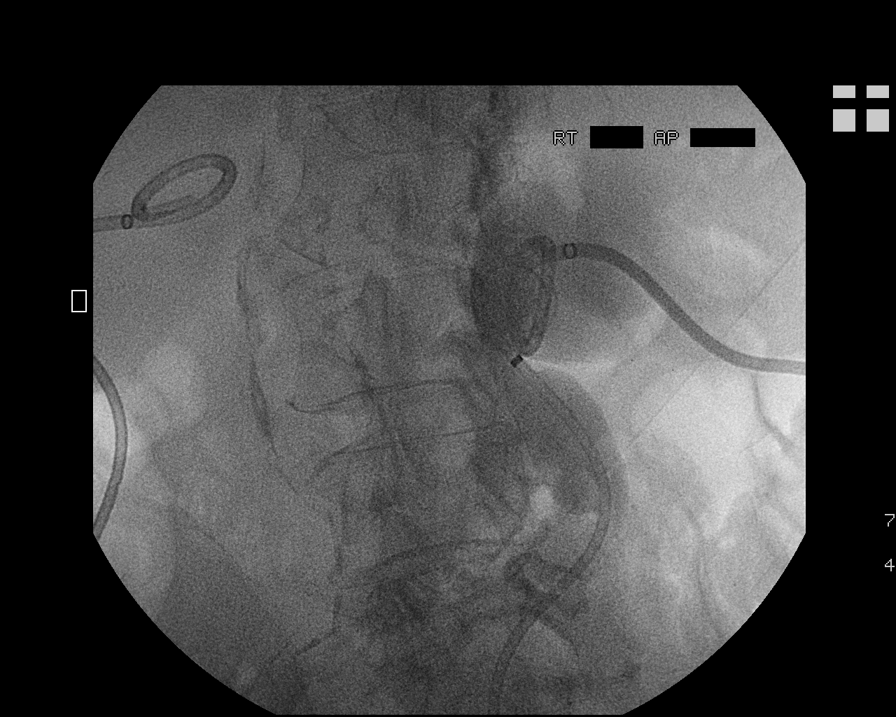

[8 of 8 positions shown; findings below may reference images not displayed]

EXAM:
1. ULTRASOUND GUIDANCE FOR PUNCTURE OF THE RIGHT RENAL COLLECTING
SYSTEM.
2. RIGHT PERCUTANEOUS NEPHROSTOMY TUBE PLACEMENT.
3. EXCHANGE OF LEFT PERCUTANEOUS NEPHROSTOMY TUBE.

MEDICATIONS:
400 mg IV Cipro. Ciprofloxacin was given within two hours of
incision.

ANESTHESIA/SEDATION:
2.0 mg IV Versed; 100 mcg IV Fentanyl.

Total Moderate Sedation Time

30 minutes

CONTRAST:  30 ml Omnipaque 300
FLUOROSCOPY TIME:  1 min and 54 seconds.

PROCEDURE:
The procedure, risks, benefits, and alternatives were explained to
the patient. Questions regarding the procedure were encouraged and
answered. The patient understands and consents to the procedure.

Bilateral flank regions were prepped with Betadine in a sterile
fashion, and a sterile drape was applied covering the operative
field. A sterile gown and sterile gloves were used for the
procedure. Local anesthesia was provided with 1% Lidocaine.

The pre-existing left nephrostomy tube was injected with contrast
material. It was then cut and removed over a guidewire. A 5 French
Kumpe the catheter was introduced into the tract. A guidewire was
advanced into the renal collecting system. A new 10 French
nephrostomy tube was then advanced over the wire and formed. The
tube was injected with contrast and a fluoroscopic spot image
obtained to confirm tube position. The catheter was secured at the
skin with a Prolene retention suture.

Ultrasound was used to localize the right kidney. Under direct
ultrasound guidance, a 21 gauge needle was advanced into the renal
collecting system. Ultrasound image documentation was performed.
Aspiration of urine sample was performed followed by contrast
injection.

A transitional dilator was advanced over a guidewire. Percutaneous
tract dilatation was then performed over the guidewire. A 10 -French
percutaneous nephrostomy tube was then advanced and formed in the
collecting system. Catheter position was confirmed by fluoroscopy
after contrast injection. The catheter was secured at the skin with
a Prolene retention suture. A gravity bag was placed.

COMPLICATIONS:
None.
FINDINGS: Injection of the left nephrostomy tube shows tube retraction
laterally with barely any of the catheter remaining in the
collecting system. Injected contrast did enter the collecting system
and shows evidence of significant left hydronephrosis. A guidewire
was able to be redirected into the renal pelvis with assistance from
a diagnostic catheter. This allowed replacement of the nephrostomy
tube with a new 10 French catheter formed in the renal pelvis. This
tube is now draining well.

Ultrasound confirms the presence of massive right-sided
hydronephrosis. The ureteral stent is likely malfunctioning and not
allowing proper urinary drainage. A right nephrostomy tube was
placed and formed in the renal pelvis. This is now draining blood
tinged urine.
IMPRESSION: 1. Replacement of malpositioned left nephrostomy tube. A new 10
French catheter was placed and formed in the renal pelvis.
2. Placement of new right sided 10 French percutaneous nephrostomy
tube to treat severe hydronephrosis.
